# Patient Record
Sex: Male | Born: 1995 | Race: Black or African American | Hispanic: No | Marital: Single | Smoking: Never smoker
Health system: Southern US, Community
[De-identification: ages and names within clinical notes are randomized; demographics above are authoritative.]

## PROBLEM LIST (undated history)

## (undated) DIAGNOSIS — I2699 Other pulmonary embolism without acute cor pulmonale: Secondary | ICD-10-CM

## (undated) DIAGNOSIS — Z789 Other specified health status: Secondary | ICD-10-CM

---
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
## 2014-10-19 ENCOUNTER — Encounter: Payer: Self-pay | Admitting: Pediatrics

## 2015-04-19 ENCOUNTER — Observation Stay (HOSPITAL_BASED_OUTPATIENT_CLINIC_OR_DEPARTMENT_OTHER)
Admission: EM | Admit: 2015-04-19 | Discharge: 2015-04-21 | Disposition: A | Discharge status: Home / Self Care | Payer: BLUE CROSS/BLUE SHIELD | Attending: Internal Medicine | Admitting: Internal Medicine

## 2015-04-19 ENCOUNTER — Encounter (HOSPITAL_BASED_OUTPATIENT_CLINIC_OR_DEPARTMENT_OTHER): Payer: Self-pay

## 2015-04-19 DIAGNOSIS — R52 Pain, unspecified: Secondary | ICD-10-CM

## 2015-04-19 DIAGNOSIS — I2699 Other pulmonary embolism without acute cor pulmonale: Principal | ICD-10-CM | POA: Diagnosis present

## 2015-04-19 DIAGNOSIS — R079 Chest pain, unspecified: Secondary | ICD-10-CM

## 2015-04-19 HISTORY — DX: Other specified health status: Z78.9

## 2015-04-19 NOTE — ED Notes (Signed)
Pt c/o right upper back pain worse with movement, has not tried any medications at home, no recent cough, no strenuous movements.

## 2015-04-19 NOTE — ED Notes (Signed)
MD at bedside. 

## 2015-04-19 NOTE — ED Provider Notes (Signed)
CSN: [id number]     Arrival date & time 04/19/15  2112 History  By signing my name below, I, Gwenyth Ober, attest that this documentation has been prepared under the direction and in the presence of Tameria Patti, MD   Electronically Signed: Gwenyth Ober, ED Scribe. 04/19/2015. 11:59 PM.   Chief Complaint  Patient presents with  . Back Pain   Patient is a 19 y.o. male presenting with back pain. The history is provided by the patient. No language interpreter was used.  Back Pain Location:  Thoracic spine (right side mid scapular line) Quality:  Aching Radiates to:  Does not radiate Pain severity:  Moderate Pain is:  Same all the time Onset quality:  Sudden Timing:  Constant Progression:  Unchanged Chronicity:  New Context: not recent illness and not twisting   Relieved by:  None tried Worsened by:  Nothing tried Ineffective treatments:  None tried Associated symptoms: no chest pain and no leg pain   Risk factors: no hx of cancer and no recent surgery     HPI Comments: Equan Cogbill is a 19 y.o. male who presents to the Emergency Department complaining of sudden onset, moderate upper back pain. Pt has not tried any treatment for his symptoms PTA. His pain becomes worse with leaning forward. Pt states he is usually working from a sitting position for 5-8 hours at a time. His pain increased while working today. He denies recent long travel. Pt also denies SOB, cough, congestion, leg swelling and leg pain.  History reviewed. No pertinent past medical history. Past Surgical History  Procedure Laterality Date  . Orchiopexy  02/25/2012    Procedure: ORCHIOPEXY ADULT;  Surgeon: Marcine Matar, MD;  Location: [facility] OR;  Service: Urology;  Laterality: Bilateral;   No family history on file. Social History  Substance Use Topics  . Smoking status: Never Smoker   . Smokeless tobacco: None  . Alcohol Use: No    Review of Systems  HENT: Negative for congestion.   Respiratory:  Negative for cough and shortness of breath.   Cardiovascular: Negative for chest pain, palpitations and leg swelling.  Musculoskeletal: Positive for back pain. Negative for arthralgias.  All other systems reviewed and are negative.   Allergies  Review of patient's allergies indicates no known allergies.  Home Medications   Prior to Admission medications   Not on File   BP 116/66 mmHg  Pulse 71  Temp(Src) 97.7 F (36.5 C) (Oral)  Resp 18  Ht  (1.854 m)  Wt 135 lb (61.236 kg)  BMI 17.82 kg/m2  SpO2 100% Physical Exam  Constitutional: He is oriented to person, place, and time. He appears well-developed and well-nourished. No distress.  HENT:  Head: Normocephalic and atraumatic.  Mouth/Throat: Oropharynx is clear and moist. No oropharyngeal exudate.  Eyes: Conjunctivae and EOM are normal. Pupils are equal, round, and reactive to light.  Neck: Normal range of motion. Neck supple. No tracheal deviation present.  Cardiovascular: Normal rate, regular rhythm and normal heart sounds.   Pulmonary/Chest: Effort normal and breath sounds normal. No respiratory distress. He has no wheezes. He has no rales.  Abdominal: Soft. Bowel sounds are normal. There is no tenderness. There is no rebound and no guarding.  Musculoskeletal: Normal range of motion. He exhibits no edema or tenderness.  No step-offs, crepitus or point tenderness of C, T or L-spine  Neurological: He is alert and oriented to person, place, and time. He has normal reflexes.  Skin: Skin is  warm and dry.  Psychiatric: He has a normal mood and affect. His behavior is normal.  Nursing note and vitals reviewed.   ED Course  Procedures   DIAGNOSTIC STUDIES: Oxygen Saturation is 100% on RA, normal by my interpretation.    COORDINATION OF CARE: 11:57 PM Discussed treatment plan with pt which includes a chest x-ray and D-dimer. Pt agreed to plan.   Labs Review Labs Reviewed  D-DIMER, QUANTITATIVE (NOT AT [facility])     Imaging Review No results found. I have personally reviewed and evaluated these images and lab results as part of my medical decision-making.   EKG Interpretation None      MDM   Final diagnoses:  None    Results for orders placed or performed during the hospital encounter of 04/19/15  D-dimer, quantitative (not at [facility])  Result Value Ref Range   D-Dimer, Quant 0.62 (H) 0.00 - 0.48 ug/mL-FEU  CBC with Differential/Platelet  Result Value Ref Range   WBC 9.2 4.0 - 10.5 K/uL   RBC 4.96 4.22 - 5.81 MIL/uL   Hemoglobin 14.0 13.0 - 17.0 g/dL   HCT 16.1 09.6 - 04.5 %   MCV 84.7 78.0 - 100.0 fL   MCH 28.2 26.0 - 34.0 pg   MCHC 33.3 30.0 - 36.0 g/dL   RDW 40.9 81.1 - 91.4 %   Platelets 264 150 - 400 K/uL   Neutrophils Relative % 49 %   Neutro Abs 4.5 1.7 - 7.7 K/uL   Lymphocytes Relative 40 %   Lymphs Abs 3.7 0.7 - 4.0 K/uL   Monocytes Relative 10 %   Monocytes Absolute 0.9 0.1 - 1.0 K/uL   Eosinophils Relative 1 %   Eosinophils Absolute 0.1 0.0 - 0.7 K/uL   Basophils Relative 0 %   Basophils Absolute 0.0 0.0 - 0.1 K/uL  Basic metabolic panel  Result Value Ref Range   Sodium 142 135 - 145 mmol/L   Potassium 4.9 3.5 - 5.1 mmol/L   Chloride 108 101 - 111 mmol/L   CO2 28 22 - 32 mmol/L   Glucose, Bld 92 65 - 99 mg/dL   BUN 13 6 - 20 mg/dL   Creatinine, Ser 7.82 0.61 - 1.24 mg/dL   Calcium 9.1 8.9 - 95.6 mg/dL   GFR calc non Af Amer >60 >60 mL/min   GFR calc Af Amer >60 >60 mL/min   Anion gap 6 5 - 15   Dg Chest 2 View  04/20/2015   CLINICAL DATA:  RIGHT upper back pain, epigastric and mid chest discomfort.  EXAM: CHEST  2 VIEW  COMPARISON:  None.  FINDINGS: Cardiomediastinal silhouette is normal. The lungs are clear without pleural effusions or focal consolidations. Trachea projects midline and there is no pneumothorax. Soft tissue planes and included osseous structures are non-suspicious.  IMPRESSION: Normal chest.   Electronically Signed   By: Awilda Metro M.D.    On: 04/20/2015 01:43   Ct Angio Chest Pe W/cm &/or Wo Cm  04/20/2015   CLINICAL DATA:  Elevated D-dimer with left anterior lower chest pain for 4 weeks  EXAM: CT ANGIOGRAPHY CHEST WITH CONTRAST  TECHNIQUE: Multidetector CT imaging of the chest was performed using the standard protocol during bolus administration of intravenous contrast. Multiplanar CT image reconstructions and MIPs were obtained to evaluate the vascular anatomy.  CONTRAST:  OMNIPAQUE IOHEXOL 350 MG/ML SOLN  COMPARISON:  None.  FINDINGS: THORACIC INLET/BODY WALL:  No acute abnormality.  MEDIASTINUM:  There are bilateral subsegmental pulmonary  emboli seen in the right lower lobe on image 183 and likely on 207, and on the left in the lingula on image 169. No ischemic changes.  Normal heart size.  No pericardial effusion.  Negative aorta.  LUNG WINDOWS:  No consolidation.  No effusion.  UPPER ABDOMEN:  No acute findings.  OSSEOUS:  No acute fracture.  No suspicious lytic or blastic lesions.  Critical Value/emergent results were called by telephone at the time of interpretation on 04/20/2015 at 2:02 am to Dr. Cy Blamer , who verbally acknowledged these results.  Review of the MIP images confirms the above findings.  IMPRESSION: Bilateral subsegmental pulmonary embolism.   Electronically Signed   By: Marnee Spring M.D.   On: 04/20/2015 02:04    Medications  ketorolac (TORADOL) 30 MG/ML injection 30 mg (30 mg Intravenous Given 04/20/15 0025)  iohexol (OMNIPAQUE) 350 MG/ML injection 75 mL (100 mLs Intravenous Contrast Given 04/20/15 0142)  Rivaroxaban (XARELTO) tablet 15 mg (15 mg Oral Given 04/20/15 0236)   Will admit for B PEs  Per Dr. Mliss Sax and observation status not heparin   I personally performed the services described in this documentation, which was scribed in my presence. The recorded information has been reviewed and is accurate.     Cy Blamer, MD 04/20/15 [phone]

## 2015-04-20 ENCOUNTER — Emergency Department (HOSPITAL_BASED_OUTPATIENT_CLINIC_OR_DEPARTMENT_OTHER): Payer: BLUE CROSS/BLUE SHIELD

## 2015-04-20 ENCOUNTER — Encounter (HOSPITAL_BASED_OUTPATIENT_CLINIC_OR_DEPARTMENT_OTHER): Payer: Self-pay | Admitting: Emergency Medicine

## 2015-04-20 ENCOUNTER — Other Ambulatory Visit: Payer: Self-pay | Admitting: Hematology

## 2015-04-20 DIAGNOSIS — I2699 Other pulmonary embolism without acute cor pulmonale: Principal | ICD-10-CM

## 2015-04-20 HISTORY — DX: Other pulmonary embolism without acute cor pulmonale: I26.99

## 2015-04-20 LAB — CBC WITH DIFFERENTIAL/PLATELET
BASOS ABS: 0 10*3/uL (ref 0.0–0.1)
BASOS PCT: 0 %
Basophils Absolute: 0 10*3/uL (ref 0.0–0.1)
Basophils Relative: 0 %
EOS PCT: 1 %
Eosinophils Absolute: 0.1 10*3/uL (ref 0.0–0.7)
Eosinophils Absolute: 0.1 10*3/uL (ref 0.0–0.7)
Eosinophils Relative: 2 %
HCT: 42 % (ref 39.0–52.0)
HEMATOCRIT: 41 % (ref 39.0–52.0)
HEMOGLOBIN: 14 g/dL (ref 13.0–17.0)
Hemoglobin: 14 g/dL (ref 13.0–17.0)
LYMPHS ABS: 1.9 10*3/uL (ref 0.7–4.0)
Lymphocytes Relative: 28 %
Lymphocytes Relative: 40 %
Lymphs Abs: 3.7 10*3/uL (ref 0.7–4.0)
MCH: 28.2 pg (ref 26.0–34.0)
MCH: 29.1 pg (ref 26.0–34.0)
MCHC: 33.3 g/dL (ref 30.0–36.0)
MCHC: 34.1 g/dL (ref 30.0–36.0)
MCV: 84.7 fL (ref 78.0–100.0)
MCV: 85.2 fL (ref 78.0–100.0)
MONO ABS: 0.9 10*3/uL (ref 0.1–1.0)
MONOS PCT: 9 %
Monocytes Absolute: 0.6 10*3/uL (ref 0.1–1.0)
Monocytes Relative: 10 %
NEUTROS ABS: 4.1 10*3/uL (ref 1.7–7.7)
NEUTROS ABS: 4.5 10*3/uL (ref 1.7–7.7)
NEUTROS PCT: 61 %
Neutrophils Relative %: 49 %
PLATELETS: 264 10*3/uL (ref 150–400)
Platelets: 245 10*3/uL (ref 150–400)
RBC: 4.81 MIL/uL (ref 4.22–5.81)
RBC: 4.96 MIL/uL (ref 4.22–5.81)
RDW: 12.8 % (ref 11.5–15.5)
RDW: 12.9 % (ref 11.5–15.5)
WBC: 6.7 10*3/uL (ref 4.0–10.5)
WBC: 9.2 10*3/uL (ref 4.0–10.5)

## 2015-04-20 LAB — COMPREHENSIVE METABOLIC PANEL
ALT: 19 U/L (ref 17–63)
ANION GAP: 5 (ref 5–15)
AST: 25 U/L (ref 15–41)
Albumin: 3.8 g/dL (ref 3.5–5.0)
Alkaline Phosphatase: 63 U/L (ref 38–126)
BUN: 8 mg/dL (ref 6–20)
CHLORIDE: 108 mmol/L (ref 101–111)
CO2: 27 mmol/L (ref 22–32)
Calcium: 9.1 mg/dL (ref 8.9–10.3)
Creatinine, Ser: 0.66 mg/dL (ref 0.61–1.24)
GFR calc non Af Amer: >60 mL/min (ref >60)
Glucose, Bld: 107 mg/dL — ABNORMAL HIGH (ref 65–99)
POTASSIUM: 3.8 mmol/L (ref 3.5–5.1)
SODIUM: 140 mmol/L (ref 135–145)
Total Bilirubin: 0.8 mg/dL (ref 0.3–1.2)
Total Protein: 5.9 g/dL — ABNORMAL LOW (ref 6.5–8.1)

## 2015-04-20 LAB — BASIC METABOLIC PANEL
ANION GAP: 6 (ref 5–15)
BUN: 13 mg/dL (ref 6–20)
CALCIUM: 9.1 mg/dL (ref 8.9–10.3)
CO2: 28 mmol/L (ref 22–32)
Chloride: 108 mmol/L (ref 101–111)
Creatinine, Ser: 0.82 mg/dL (ref 0.61–1.24)
Glucose, Bld: 92 mg/dL (ref 65–99)
Potassium: 4.9 mmol/L (ref 3.5–5.1)
Sodium: 142 mmol/L (ref 135–145)

## 2015-04-20 LAB — D-DIMER, QUANTITATIVE (NOT AT ARMC): D DIMER QUANT: 0.62 ug{FEU}/mL — AB (ref 0.00–0.48)

## 2015-04-20 LAB — TROPONIN I

## 2015-04-20 LAB — BRAIN NATRIURETIC PEPTIDE: B NATRIURETIC PEPTIDE 5: 28.8 pg/mL (ref 0.0–100.0)

## 2015-04-20 MED ORDER — HEPARIN BOLUS VIA INFUSION: 4000.0000 [IU] | Freq: Once | INTRAVENOUS | Status: DC

## 2015-04-20 MED ORDER — ONDANSETRON HCL 4 MG PO TABS: 4.0000 mg | ORAL_TABLET | Freq: Four times a day (QID) | ORAL | Status: DC | PRN

## 2015-04-20 MED ORDER — HEPARIN (PORCINE) IN NACL 100-0.45 UNIT/ML-% IJ SOLN: 1100.0000 [IU]/h | INTRAMUSCULAR | Status: DC

## 2015-04-20 MED ORDER — SODIUM CHLORIDE 0.9 % IV SOLN
INTRAVENOUS | Status: DC
  Administered 2015-04-20: 06:00:00 via INTRAVENOUS

## 2015-04-20 MED ORDER — ACETAMINOPHEN 325 MG PO TABS: 650.0000 mg | ORAL_TABLET | Freq: Four times a day (QID) | ORAL | Status: DC | PRN

## 2015-04-20 MED ORDER — RIVAROXABAN 15 MG PO TABS
15.0000 mg | ORAL_TABLET | Freq: Two times a day (BID) | ORAL | Status: DC
  Administered 2015-04-20 – 2015-04-21 (×2): 15 mg via ORAL
  Filled 2015-04-20 (×2): qty 1

## 2015-04-20 MED ORDER — IOHEXOL 350 MG/ML SOLN
75.0000 mL | Freq: Once | INTRAVENOUS | Status: AC | PRN
  Administered 2015-04-20: 100 mL via INTRAVENOUS

## 2015-04-20 MED ORDER — RIVAROXABAN (XARELTO) EDUCATION KIT FOR DVT/PE PATIENTS
PACK | Freq: Once | Status: AC
  Administered 2015-04-20: 12:00:00
  Filled 2015-04-20 (×2): qty 1

## 2015-04-20 MED ORDER — ACETAMINOPHEN 650 MG RE SUPP: 650.0000 mg | Freq: Four times a day (QID) | RECTAL | Status: DC | PRN

## 2015-04-20 MED ORDER — ONDANSETRON HCL 4 MG/2ML IJ SOLN: 4.0000 mg | Freq: Four times a day (QID) | INTRAMUSCULAR | Status: DC | PRN

## 2015-04-20 MED ORDER — KETOROLAC TROMETHAMINE 30 MG/ML IJ SOLN
30.0000 mg | Freq: Once | INTRAMUSCULAR | Status: AC
  Administered 2015-04-20: 30 mg via INTRAVENOUS
  Filled 2015-04-20: qty 1

## 2015-04-20 MED ORDER — RIVAROXABAN 15 MG PO TABS
15.0000 mg | ORAL_TABLET | Freq: Once | ORAL | Status: AC
  Administered 2015-04-20: 15 mg via ORAL
  Filled 2015-04-20: qty 1

## 2015-04-20 NOTE — ED Notes (Signed)
Pt transferred to [facility] via [facility] 

## 2015-04-20 NOTE — Progress Notes (Addendum)
Pharmacist Provided - Patient Medication Education    Frank Frederick is an 19 y.o. male who presented to [facility] on 04/19/2015 with a chief complaint of  Chief Complaint  Patient presents with  . Back Pain     The following medications were discussed with the patient: Xarelto  Pain Control medications:  Yes     No  Diabetes Medications:  Yes     No  Heart Failure Medications:  Yes     No  Anticoagulation Medications:   Yes     No  Antibiotics at discharge:  Yes     No  Allergy Assessment Completed and Updated:  Yes     No Identified Patient Allergies: No Known Allergies   Medication Adherence Assessment: No PTA medications Barriers to Obtaining Medications:  Yes  No   Assessment: Educated patient on dosing regimen changes that will occur during his treatment with Xarelto (Xarelto 15 mg BID x 21 days, then 20 mg daily). Patient voiced understanding. Reviewed s/sx of bleeding.  Time spent preparing for counseling: 5 Time spent counseling patient: 10  Remi Haggard, PharmD Clinical Pharmacist- Resident Pager: [phone]  Remi Haggard, PharmD 04/20/2015, 3:08 PM

## 2015-04-20 NOTE — Care Management Note (Signed)
Case Management Note  Patient Details  Name: Frank Frederick MRN: [id number] Date of Birth: Dec 02, 1995  Subjective/Objective:      Date: 04/20/15 Spoke with patient at the bedside. Introduced self as Sports coach and explained role in discharge planning and how to be reached. Verified patient lives in town, with parents,  . Expressed no potential need for no other DME. Verified patient anticipates to go home with family,  at time of discharge and will have full-time  supervision by family  at this time to best of their knowledge. Patient  denied needing help with their medication. Patient is driven by  Mother or father to MD appointments. Verified patient has PCP [place]. Patient would like for NCM to call his mother and inform her about the xarelto medication, her name is Justina [phone].  NCM called Justina and informed her about the savings cards.   Plan: CM will continue to follow for discharge planning and [facility] resources.               Action/Plan:   Expected Discharge Date:                  Expected Discharge Plan:  Home/Self Care  In-House Referral:     Discharge planning Services  CM Consult  Post Acute Care Choice:    Choice offered to:     DME Arranged:    DME Agency:     HH Arranged:    HH Agency:     Status of Service:  Completed, signed off  Medicare Important Message Given:    Date Medicare IM Given:    Medicare IM give by:    Date Additional Medicare IM Given:    Additional Medicare Important Message give by:     If discussed at Long Length of Stay Meetings, dates discussed:    Additional Comments:  Leone Haven, RN 04/20/2015, 1:40 PM

## 2015-04-20 NOTE — Progress Notes (Signed)
Patient Demographics  Frank Frederick, is a 19 y.o. male, DOB - September 06, 1995, ZOX:[id number]  Admit date - 04/19/2015   Admitting Physician Rolly Salter, MD  Outpatient Primary MD for the patient is Georgiann Hahn, MD  LOS -    Chief Complaint  Patient presents with  . Back Pain         Subjective:   [name] today has, No headache, No chest pain, No abdominal pain - No Nausea, No new weakness tingling or numbness, No Cough - SOB.   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 - Hemodynamically stable, continue to monitor on cardiac telemetry. - Started on Xarelto. - Genetic part of hypertrophic hypercoagulable workup was ordered. - Patient to follow with Dr. Candise Che  as an outpatient. - Observation for 24 hours.  Code Status: Full  Family Communication: Father and mother at bedside  Disposition Plan: Home in 24 hours.   Procedures None   Consults   Discussed with hematology over the phone   Medications  Scheduled Meds: . Rivaroxaban  15 mg Oral BID WC   Continuous Infusions: . sodium chloride 10 mL/hr at 04/20/15 0605   PRN Meds:.acetaminophen **OR** acetaminophen, ondansetron **OR** ondansetron (ZOFRAN) IV  DVT Prophylaxis  Xarelto  Lab Results  Component Value Date   PLT 245 04/20/2015    Antibiotics    Anti-infectives    None          Objective:   Filed Vitals:   04/20/15 0207 04/20/15 0230 04/20/15 0300 04/20/15 0355  BP:  122/67 116/86 115/68  Pulse:  60 61 63  Temp:   97.5 F (36.4 C) 97.7 F (36.5 C)  TempSrc:    Oral  Resp:  Height:      Weight: 59.875 kg (132 lb)     SpO2:  99% 100% 100%    Wt  Readings from Last 3 Encounters:  04/20/15 59.875 kg (132 lb)  02/17/13 55.43 kg (122 lb 3.2 oz) (7 %*, Z = -1.44)  02/25/12 55.792 kg (123 lb) (13 %*, Z = -1.13)   * Growth percentiles are based on CDC 2-20 Years data.    No intake or output data in the 24 hours ending 04/20/15 1150   Physical Exam  Awake Alert, Oriented X 3, No new F.N deficits, Normal affect [place].AT,PERRAL Supple Neck,No JVD, No cervical lymphadenopathy appriciated.  Symmetrical Chest wall movement, Good air movement bilaterally, CTAB RRR,No Gallops,Rubs or new Murmurs, No Parasternal Heave +ve B.Sounds, Abd Soft, No tenderness, No organomegaly appriciated, No rebound - guarding or rigidity. No Cyanosis, Clubbing or edema, No new Rash or bruise     Data Review   Micro Results No results found for this or any previous visit (from the past 240 hour(s)).  Radiology Reports Dg Chest 2 View  04/20/2015   CLINICAL DATA:  RIGHT upper back pain, epigastric and mid chest discomfort.  EXAM: CHEST  2 VIEW  COMPARISON:  None.  FINDINGS: Cardiomediastinal silhouette is  normal. The lungs are clear without pleural effusions or focal consolidations. Trachea projects midline and there is no pneumothorax. Soft tissue planes and included osseous structures are non-suspicious.  IMPRESSION: Normal chest.   Electronically Signed   By: Awilda Metro M.D.   On: 04/20/2015 01:43   Ct Angio Chest Pe W/cm &/or Wo Cm  04/20/2015   CLINICAL DATA:  Elevated D-dimer with left anterior lower chest pain for 4 weeks  EXAM: CT ANGIOGRAPHY CHEST WITH CONTRAST  TECHNIQUE: Multidetector CT imaging of the chest was performed using the standard protocol during bolus administration of intravenous contrast. Multiplanar CT image reconstructions and MIPs were obtained to evaluate the vascular anatomy.  CONTRAST:  OMNIPAQUE IOHEXOL 350 MG/ML SOLN  COMPARISON:  None.  FINDINGS: THORACIC INLET/BODY WALL:  No acute abnormality.  MEDIASTINUM:  There are  bilateral subsegmental pulmonary emboli seen in the right lower lobe on image 183 and likely on 207, and on the left in the lingula on image 169. No ischemic changes.  Normal heart size.  No pericardial effusion.  Negative aorta.  LUNG WINDOWS:  No consolidation.  No effusion.  UPPER ABDOMEN:  No acute findings.  OSSEOUS:  No acute fracture.  No suspicious lytic or blastic lesions.  Critical Value/emergent results were called by telephone at the time of interpretation on 04/20/2015 at 2:02 am to Dr. Cy Blamer , who verbally acknowledged these results.  Review of the MIP images confirms the above findings.  IMPRESSION: Bilateral subsegmental pulmonary embolism.   Electronically Signed   By: Marnee Spring M.D.   On: 04/20/2015 02:04     CBC  Recent Labs Lab 04/20/15 0015 04/20/15 1101  WBC 9.2 6.7  HGB 14.0 14.0  HCT 42.0 41.0  PLT 264 245  MCV 84.7 85.2  MCH 28.2 29.1  MCHC 33.3 34.1  RDW 12.8 12.9  LYMPHSABS 3.7 1.9  MONOABS 0.9 0.6  EOSABS 0.1 0.1  BASOSABS 0.0 0.0    Chemistries   Recent Labs Lab 04/20/15 0015  NA 142  K 4.9  CL 108  CO2 28  GLUCOSE 92  BUN 13  CREATININE 0.82  CALCIUM 9.1   ------------------------------------------------------------------------------------------------------------------ estimated creatinine clearance is 121.7 mL/min (by C-G formula based on Cr of 0.82). ------------------------------------------------------------------------------------------------------------------ No results for input(s): HGBA1C in the last 72 hours. ------------------------------------------------------------------------------------------------------------------ No results for input(s): CHOL, HDL, LDLCALC, TRIG, CHOLHDL, LDLDIRECT in the last 72 hours. ------------------------------------------------------------------------------------------------------------------ No results for input(s): TSH, T4TOTAL, T3FREE, THYROIDAB in the last 72 hours.  Invalid  input(s): FREET3 ------------------------------------------------------------------------------------------------------------------ No results for input(s): VITAMINB12, FOLATE, FERRITIN, TIBC, IRON, RETICCTPCT in the last 72 hours.  Coagulation profile No results for input(s): INR, PROTIME in the last 168 hours.   Recent Labs  04/19/15 2355  DDIMER 0.62*    Cardiac Enzymes No results for input(s): CKMB, TROPONINI, MYOGLOBIN in the last 168 hours.  Invalid input(s): CK ------------------------------------------------------------------------------------------------------------------ Invalid input(s): POCBNP     Time Spent in minutes   30 minutes   ELGERGAWY, DAWOOD M.D on 04/20/2015 at 11:50 AM  Between 7am to 7pm - Pager - [phone]  After 7pm go to [email or web address] - password [email or web address]  [facility]   Office  [phone]

## 2015-04-20 NOTE — Progress Notes (Addendum)
Per rep:   Xarelto: Frank Frederick is required [phone]   Patient has deductible of $6393.00- $0 has been met- patient will have to satisfy deductible before a co-pay can be given.   Currently the cost of the medication will be approx $109.65 for 30 day supply at retail    [facility] will give patient free 30 day supply and also the $0 co pay savings cards.     88m bid for 21 days pre auth case number is [id number] auth case number is [id number] auJosem Kaufmanns good til sept 30 2017

## 2015-04-20 NOTE — Discharge Instructions (Signed)
Information on my medicine - XARELTO® (rivaroxaban) ° °This medication education was reviewed with me or my healthcare representative as part of my discharge preparation.  The pharmacist that spoke with me during my hospital stay was:  Endre Coutts Kay, RPH ° °WHY WAS XARELTO® PRESCRIBED FOR YOU? °Xarelto® was prescribed to treat blood clots that may have been found in the veins of your legs (deep vein thrombosis) or in your lungs (pulmonary embolism) and to reduce the risk of them occurring again. ° °What do you need to know about Xarelto®? °The starting dose is one 15 mg tablet taken TWICE daily with food for the FIRST 21 DAYS then  the dose is changed to one 20 mg tablet taken ONCE A DAY with your evening meal. ° °DO NOT stop taking Xarelto® without talking to the health care provider who prescribed the medication.  Refill your prescription for 20 mg tablets before you run out. ° °After discharge, you should have regular check-up appointments with your healthcare provider that is prescribing your Xarelto®.  In the future your dose may need to be changed if your kidney function changes by a significant amount. ° °What do you do if you miss a dose? °If you are taking Xarelto® TWICE DAILY and you miss a dose, take it as soon as you remember. You may take two 15 mg tablets (total 30 mg) at the same time then resume your regularly scheduled 15 mg twice daily the next day. ° °If you are taking Xarelto® ONCE DAILY and you miss a dose, take it as soon as you remember on the same day then continue your regularly scheduled once daily regimen the next day. Do not take two doses of Xarelto® at the same time.  ° °Important Safety Information °Xarelto® is a blood thinner medicine that can cause bleeding. You should call your healthcare provider right away if you experience any of the following: °? Bleeding from an injury or your nose that does not stop. °? Unusual colored urine (red or dark brown) or unusual colored stools  (red or black). °? Unusual bruising for unknown reasons. °? A serious fall or if you hit your head (even if there is no bleeding). ° °Some medicines may interact with Xarelto® and might increase your risk of bleeding while on Xarelto®. To help avoid this, consult your healthcare provider or pharmacist prior to using any new prescription or non-prescription medications, including herbals, vitamins, non-steroidal anti-inflammatory drugs (NSAIDs) and supplements. ° °This website has more information on Xarelto®: www.xarelto.com. °

## 2015-04-20 NOTE — Progress Notes (Signed)
ANTICOAGULATION CONSULT NOTE - Initial Consult  Pharmacy Consult for Xarelto Indication: pulmonary embolus  No Known Allergies  Patient Measurements: Height:  (185.4 cm) Weight: 132 lb (59.875 kg) IBW/kg (Calculated) : 79.9  Vital Signs: Temp: 97.7 F (36.5 C) (09/30 0355) Temp Source: Oral (09/30 0355) BP: 115/68 mmHg (09/30 0355) Pulse Rate: 63 (09/30 0355)  Labs:  Recent Labs  04/20/15 0015  HGB 14.0  HCT 42.0  PLT 264  CREATININE 0.82    Estimated Creatinine Clearance: 121.7 mL/min (by C-G formula based on Cr of 0.82).   Medical History: Past Medical History  Diagnosis Date  . Medical history non-contributory     Medications:  No prescriptions prior to admission    Assessment: 19 y.o. male with new PE for Xarelto.     Plan:  Xarelto 15 mg BID x 21 days, then 20 mg daily  Abbott, Gary Fleet 04/20/2015,5:34 AM

## 2015-04-20 NOTE — Progress Notes (Signed)
NCM awaiting benefits check for xarelto.

## 2015-04-20 NOTE — H&P (Signed)
[facility] History and Physical  Antionne Enrique ZOX:[id number] DOB: 11-20-1995 DOA: 04/19/2015  Referring physician: Patient was transferred from [facility] [place]. PCP: Georgiann Hahn, MD  Specialists: None.  Chief Complaint: Right-sided chest pain.  HPI: Frank Frederick is a 19 y.o. male with no significant past medical history presented to the ER because of ongoing right-sided chest pain. Pain is mostly on deep breathing. Denies any shortness of breath. Patient has been having these symptoms for last 2 weeks. Denies any recent travel or any recent surgery. CT angiogram of the chest shows bilateral subsegmental pulmonary embolism. Patient was hemodynamically stable and started on Zaroxolyn admitted for further observation. On my exam patient is presently not in distress. Patient has not had any previous history of DVT or pulmonary embolism. No family history of DVT or pulmonary embolism. Patient states he sits a lot on computer.   Review of Systems: As presented in the history of presenting illness, rest negative.  Past Medical History  Diagnosis Date  . Medical history non-contributory    Past Surgical History  Procedure Laterality Date  . Orchiopexy  02/25/2012    Procedure: ORCHIOPEXY ADULT;  Surgeon: Marcine Matar, MD;  Location[facility] OR;  Service: Urology;  Laterality: Bilateral;   Social History:  reports that he has never smoked. He does not have any smokeless tobacco history on file. He reports that he does not drink alcohol or use illicit drugs. Where does patient live home. Can patient participate in ADLs? Yes.  No Known Allergies  Family History:  Family History  Problem Relation Age of Onset  . Hypertension Mother       Prior to Admission medications   Not on File    Physical Exam: Filed Vitals:   04/20/15 0207 04/20/15 0230 04/20/15 0300 04/20/15 0355  BP:  122/67 116/86 115/68  Pulse:  60 61 63  Temp:   97.5 F (36.4 C) 97.7 F (36.5 C)   TempSrc:    Oral  Resp:  Height:      Weight: 59.875 kg (132 lb)     SpO2:  99% 100% 100%     General:  Moderately built and nourished.  Eyes: Anicteric no pallor.  ENT: No discharge from the ears eyes nose and mouth.  Neck: No mass felt.  Cardiovascular: S1-S2 heard.  Respiratory: No rhonchi or crepitations.  Abdomen: Soft nontender bowel sounds present.  Skin: No rash.  Musculoskeletal: No edema.  Psychiatric: Appears normal.  Neurologic: Alert awake oriented to time place and person. Moves all extremities.  Labs on Admission:  Basic Metabolic Panel:  Recent Labs Lab 04/20/15 0015  NA 142  K 4.9  CL 108  CO2 28  GLUCOSE 92  BUN 13  CREATININE 0.82  CALCIUM 9.1   Liver Function Tests: No results for input(s): AST, ALT, ALKPHOS, BILITOT, PROT, ALBUMIN in the last 168 hours. No results for input(s): LIPASE, AMYLASE in the last 168 hours. No results for input(s): AMMONIA in the last 168 hours. CBC:  Recent Labs Lab 04/20/15 0015  WBC 9.2  NEUTROABS 4.5  HGB 14.0  HCT 42.0  MCV 84.7  PLT 264   Cardiac Enzymes: No results for input(s): CKTOTAL, CKMB, CKMBINDEX, TROPONINI in the last 168 hours.  BNP (last 3 results) No results for input(s): BNP in the last 8760 hours.  ProBNP (last 3 results) No results for input(s): PROBNP in the last 8760 hours.  CBG: No results for input(s): GLUCAP  in the last 168 hours.  Radiological Exams on Admission: Dg Chest 2 View  04/20/2015   CLINICAL DATA:  RIGHT upper back pain, epigastric and mid chest discomfort.  EXAM: CHEST  2 VIEW  COMPARISON:  None.  FINDINGS: Cardiomediastinal silhouette is normal. The lungs are clear without pleural effusions or focal consolidations. Trachea projects midline and there is no pneumothorax. Soft tissue planes and included osseous structures are non-suspicious.  IMPRESSION: Normal chest.   Electronically Signed   By: Awilda Metro M.D.   On: 04/20/2015 01:43   Ct  Angio Chest Pe W/cm &/or Wo Cm  04/20/2015   CLINICAL DATA:  Elevated D-dimer with left anterior lower chest pain for 4 weeks  EXAM: CT ANGIOGRAPHY CHEST WITH CONTRAST  TECHNIQUE: Multidetector CT imaging of the chest was performed using the standard protocol during bolus administration of intravenous contrast. Multiplanar CT image reconstructions and MIPs were obtained to evaluate the vascular anatomy.  CONTRAST:  OMNIPAQUE IOHEXOL 350 MG/ML SOLN  COMPARISON:  None.  FINDINGS: THORACIC INLET/BODY WALL:  No acute abnormality.  MEDIASTINUM:  There are bilateral subsegmental pulmonary emboli seen in the right lower lobe on image 183 and likely on 207, and on the left in the lingula on image 169. No ischemic changes.  Normal heart size.  No pericardial effusion.  Negative aorta.  LUNG WINDOWS:  No consolidation.  No effusion.  UPPER ABDOMEN:  No acute findings.  OSSEOUS:  No acute fracture.  No suspicious lytic or blastic lesions.  Critical Value/emergent results were called by telephone at the time of interpretation on 04/20/2015 at 2:02 am to Dr. Cy Blamer , who verbally acknowledged these results.  Review of the MIP images confirms the above findings.  IMPRESSION: Bilateral subsegmental pulmonary embolism.   Electronically Signed   By: Marnee Spring M.D.   On: 04/20/2015 02:04    EKG: Independently reviewed. Sinus bradycardia with RSR pattern.  Assessment/Plan Principal Problem:   Pulmonary emboli Active Problems:   Pulmonary embolism   1. Bilateral subsegmental pulmonary embolism unprovoked hemodynamically stable - patient has been placed on xarelto. Which will be continued. Patient is presently hemodynamically stable. Case manager consult for home medication. I have advised patient and patient's parents that patient will need to follow with his PCP and hematologist to further workup and hypercoagulable workup and duration of his anticoagulation which could be at least 6 months from now and  further doses will be based on his hypercoagulable workup. Check Dopplers of the lower extremity. Check troponin and BNP.   DVT Prophylaxis xarelto.  Code Status: Full code.  Family Communication: Patient's parents.  Disposition Plan: Admit for observation.    KAKRAKANDY,ARSHAD N. [facility] Pager [phone].  If 7PM-7AM, please contact night-coverage [email or web address] Password [email or web address] 04/20/2015, 5:28 AM

## 2015-04-21 ENCOUNTER — Observation Stay (HOSPITAL_BASED_OUTPATIENT_CLINIC_OR_DEPARTMENT_OTHER): Payer: BLUE CROSS/BLUE SHIELD

## 2015-04-21 DIAGNOSIS — I2699 Other pulmonary embolism without acute cor pulmonale: Secondary | ICD-10-CM | POA: Diagnosis not present

## 2015-04-21 MED ORDER — RIVAROXABAN (XARELTO) VTE STARTER PACK (15 & 20 MG)
ORAL_TABLET | ORAL | Status: DC
Start: 2015-04-21 — End: 2015-06-08

## 2015-04-21 NOTE — Progress Notes (Signed)
VASCULAR LAB PRELIMINARY  PRELIMINARY  PRELIMINARY  PRELIMINARY  Bilateral lower extremity venous duplex completed.    Preliminary report:  Bilateral:  No evidence of DVT, superficial thrombosis, or Baker's Cyst.   Frank Frederick, RVS 04/21/2015, 9:19 AM

## 2015-04-21 NOTE — Discharge Summary (Signed)
Frank Frederick, is a 19 y.o. male  DOB 1995/11/01  MRN [id number].  Admission date:  04/19/2015  Admitting Physician  Rolly Salter, MD  Discharge Date:  04/21/2015   Primary MD  Georgiann Hahn, MD  Recommendations for primary care physician for things to follow:  - Check Labs including CBC, BMP during next visit. - She told me to follow with hematology as an outpatient, to follow on hypercoagulable lab workup further recommendation for anticoagulation, patient will be seen by Zonia Kief   Admission Diagnosis  Pain [R52] Pulmonary emboli (HCC) [I26.99]   Discharge Diagnosis  Pain [R52] Pulmonary emboli (HCC) [I26.99]    Principal Problem:   Pulmonary emboli Active Problems:   Pulmonary embolism      Past Medical History  Diagnosis Date  . Medical history non-contributory     Past Surgical History  Procedure Laterality Date  . Orchiopexy  02/25/2012    Procedure: ORCHIOPEXY ADULT;  Surgeon: Marcine Matar, MD;  Location: [facility] OR;  Service: Urology;  Laterality: Bilateral;       History of present illness and  Hospital Course:     Kindly see H&P for history of present illness and admission details, please review complete Labs, Consult reports and Test reports for all details in brief  HPI  from the history and physical done on the day of admission  Frank Frederick is a 19 y.o. male with no significant past medical history presented to the ER because of ongoing right-sided chest pain. Pain is mostly on deep breathing. Denies any shortness of breath. Patient has been having these symptoms for last 2 weeks. Denies any recent travel or any recent surgery. CT angiogram of the chest shows bilateral subsegmental pulmonary embolism. Patient was hemodynamically stable and started on Zaroxolyn admitted for further observation. On my exam patient is presently not in distress. Patient has  not had any previous history of DVT or pulmonary embolism. No family history of DVT or pulmonary embolism. Patient states he sits a lot on computer.   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 Tresa Endo from hematology as an outpatient. - Venous Doppler with no evidence of DVT  Discharge Condition:  Stable   Follow UP  Follow-up Information    Follow up with Georgiann Hahn, MD. Call in 1 week.   Specialty:  Pediatrics   Why:  Posthospitalization follow-up   Contact information:   [address] [place] [address] [phone]       Follow up with Wyvonnia Lora, MD.   Specialties:  Hematology, Oncology   Why:  Will be called by his office for follow-up appointment   Contact information:  [address] [place] [address] [phone]         Discharge Instructions  and  Discharge Medications     Discharge Instructions    Discharge instructions    Complete by:  As directed   Continue with regular diet     Increase activity slowly    Complete by:  As directed             Medication List    STOP taking these medications        ALIVE MENS ENERGY PO      TAKE these medications        Rivaroxaban 15 & 20 MG Tbpk  Commonly known as:  XARELTO STARTER PACK  Take as directed on package: Start with one  tablet by mouth twice a day with food. On Day 22, switch to one  tablet once a day with food.          Diet and Activity recommendation: See Discharge Instructions above   Consults obtained -  Discussed with hematology over the phone   Major procedures and Radiology Reports -  PLEASE review detailed and final reports for all details, in brief -      Dg Chest 2 View  04/20/2015   CLINICAL DATA:  RIGHT upper back pain, epigastric and mid chest discomfort.  EXAM: CHEST  2 VIEW  COMPARISON:  None.  FINDINGS: Cardiomediastinal silhouette is normal. The lungs are clear without pleural effusions or focal consolidations. Trachea projects midline and there is no pneumothorax. Soft tissue planes and included osseous structures are non-suspicious.  IMPRESSION: Normal chest.   Electronically Signed   By: Awilda Metro M.D.   On: 04/20/2015 01:43   Ct Angio Chest Pe W/cm &/or Wo Cm  04/20/2015   CLINICAL DATA:  Elevated D-dimer with left anterior lower chest pain for 4 weeks  EXAM: CT ANGIOGRAPHY CHEST WITH CONTRAST  TECHNIQUE: Multidetector CT imaging of the chest was performed using the standard protocol during bolus administration of intravenous contrast. Multiplanar CT image reconstructions and MIPs were obtained to evaluate the vascular anatomy.  CONTRAST:  OMNIPAQUE IOHEXOL 350 MG/ML SOLN  COMPARISON:  None.  FINDINGS: THORACIC INLET/BODY WALL:  No acute abnormality.  MEDIASTINUM:  There are bilateral subsegmental pulmonary emboli seen in the right lower lobe on image 183 and likely on 207, and on the left in the lingula on image 169. No ischemic changes.  Normal heart size.  No pericardial effusion.  Negative aorta.  LUNG WINDOWS:  No consolidation.  No effusion.  UPPER ABDOMEN:  No acute findings.  OSSEOUS:  No acute fracture.  No suspicious lytic or blastic lesions.  Critical Value/emergent results were called by telephone at the time of interpretation on 04/20/2015 at 2:02 am to Dr. Cy Blamer , who verbally acknowledged these results.  Review of the MIP images confirms the above findings.  IMPRESSION: Bilateral subsegmental pulmonary embolism.   Electronically Signed   By: Marnee Spring M.D.   On: 04/20/2015 02:04    Micro Results     No results found for this  or any previous visit (from the past 240 hour(s)).     Today   Subjective:   Frank Frederick today has no headache,no abdominal pain,no new weakness tingling or numbness, feels much better wants to go home today. As any chest pain or dyspnea.  Objective:   Blood pressure 108/64, pulse 58, temperature 97.6 F (36.4 C), temperature source Oral, resp. rate 18, height   (1.854 m), weight 59.875 kg (132 lb), SpO2 100 %.   Intake/Output Summary (Last 24 hours) at 04/21/15 0936 Last data filed at 04/20/15 1800  Gross per 24 hour  Intake 239.17 ml  Output      0 ml  Net 239.17 ml    Exam Awake Alert, Oriented x 3, No new F.N deficits, Normal affect [place].AT,PERRAL Supple Neck,No JVD, No cervical lymphadenopathy appriciated.  Symmetrical Chest wall movement, Good air movement bilaterally, CTAB RRR,No Gallops,Rubs or new Murmurs, No Parasternal Heave +ve B.Sounds, Abd Soft, Non tender, No organomegaly appriciated, No rebound -guarding or rigidity. No Cyanosis, Clubbing or edema, No new Rash or bruise  Data Review   CBC w Diff: Lab Results  Component Value Date   WBC 6.7 04/20/2015   HGB 14.0 04/20/2015   HCT 41.0 04/20/2015   PLT 245 04/20/2015   LYMPHOPCT 28 04/20/2015   MONOPCT 9 04/20/2015   EOSPCT 2 04/20/2015   BASOPCT 0 04/20/2015    CMP: Lab Results  Component Value Date   NA 140 04/20/2015   K 3.8 04/20/2015   CL 108 04/20/2015   CO2 27 04/20/2015   BUN 8 04/20/2015   CREATININE 0.66 04/20/2015   PROT 5.9* 04/20/2015   ALBUMIN 3.8 04/20/2015   BILITOT 0.8 04/20/2015   ALKPHOS 63 04/20/2015   AST 25 04/20/2015   ALT 19 04/20/2015  .   Total Time in preparing paper work, data evaluation and todays exam - 35 minutes  Frank Frederick M.D on 04/21/2015 at 9:36 AM  [facility]   Office  [phone]

## 2015-04-21 NOTE — Progress Notes (Signed)
Patient discharge teaching given, including activity, diet, follow up appointment, and mediations. Patient verbalized understanding of all discharge instructions. IV access was discontinued. Vitals are stable, Skin intact except as charted in most recent assessments. Patient to be escorted out by NT, to be driven home by family. 

## 2015-04-22 LAB — BETA-2-GLYCOPROTEIN I ABS, IGG/M/A

## 2015-04-22 LAB — HOMOCYSTEINE: HOMOCYSTEINE-NORM: 5.8 umol/L (ref 0.0–15.0)

## 2015-04-23 ENCOUNTER — Telehealth: Payer: Self-pay | Admitting: Hematology

## 2015-04-23 LAB — LUPUS ANTICOAGULANT PANEL
DRVVT: 58.2 s — AB (ref 0.0–55.1)
PTT Lupus Anticoagulant: 52.2 s — ABNORMAL HIGH (ref 0.0–50.0)

## 2015-04-23 LAB — CARDIOLIPIN ANTIBODIES, IGG, IGM, IGA
Anticardiolipin IgG: <9 GPL U/mL (ref 0–14)
Anticardiolipin IgM: <9 MPL U/mL (ref 0–12)

## 2015-04-23 LAB — DRVVT MIX: DRVVT MIX: 49.3 s — AB (ref 0.0–45.4)

## 2015-04-23 LAB — PTT-LA INCUB MIX: PTT-LA Incub Mix: 46.5 s (ref 0.0–50.0)

## 2015-04-23 LAB — PTT-LA MIX: PTT-LA MIX: 43.3 s (ref 0.0–50.0)

## 2015-04-23 LAB — DRVVT CONFIRM: DRVVT CONFIRM: 1.1 ratio (ref 0.0–1.4)

## 2015-04-23 NOTE — Telephone Encounter (Signed)
Message to Tiffany in HIM re hosp fu appointment. Pof sent by GK 04/20/15 - removed from scheduling inbox.

## 2015-04-24 LAB — FACTOR 5 LEIDEN

## 2015-04-25 LAB — PROTHROMBIN GENE MUTATION

## 2015-05-14 ENCOUNTER — Telehealth: Payer: Self-pay | Admitting: Hematology

## 2015-05-14 NOTE — Telephone Encounter (Signed)
hosp f/u appt-s/w patient mother and gave appt for 11/18 @ 1015 w/Dr. Candise CheKale

## 2015-06-08 ENCOUNTER — Ambulatory Visit (HOSPITAL_BASED_OUTPATIENT_CLINIC_OR_DEPARTMENT_OTHER): Payer: BLUE CROSS/BLUE SHIELD

## 2015-06-08 ENCOUNTER — Encounter: Payer: Self-pay | Admitting: Hematology

## 2015-06-08 ENCOUNTER — Ambulatory Visit (HOSPITAL_BASED_OUTPATIENT_CLINIC_OR_DEPARTMENT_OTHER): Payer: BLUE CROSS/BLUE SHIELD | Admitting: Hematology

## 2015-06-08 ENCOUNTER — Telehealth: Payer: Self-pay | Admitting: Hematology

## 2015-06-08 VITALS — BP 109/71 | HR 65 | Resp 18 | Ht 73.0 in | Wt 130.7 lb

## 2015-06-08 DIAGNOSIS — I2699 Other pulmonary embolism without acute cor pulmonale: Secondary | ICD-10-CM

## 2015-06-08 LAB — CBC & DIFF AND RETIC
BASO%: 0.4 % (ref 0.0–2.0)
BASOS ABS: 0 10*3/uL (ref 0.0–0.1)
EOS%: 2.1 % (ref 0.0–7.0)
Eosinophils Absolute: 0.1 10*3/uL (ref 0.0–0.5)
HEMATOCRIT: 44.6 % (ref 38.4–49.9)
HGB: 15 g/dL (ref 13.0–17.1)
Immature Retic Fract: 4.1 % (ref 3.00–10.60)
LYMPH#: 1.8 10*3/uL (ref 0.9–3.3)
LYMPH%: 38.1 % (ref 14.0–49.0)
MCH: 28.4 pg (ref 27.2–33.4)
MCHC: 33.6 g/dL (ref 32.0–36.0)
MCV: 84.3 fL (ref 79.3–98.0)
MONO#: 0.5 10*3/uL (ref 0.1–0.9)
MONO%: 10.3 % (ref 0.0–14.0)
NEUT#: 2.4 10*3/uL (ref 1.5–6.5)
NEUT%: 49.1 % (ref 39.0–75.0)
PLATELETS: 239 10*3/uL (ref 140–400)
RBC: 5.29 10*6/uL (ref 4.20–5.82)
RDW: 12.9 % (ref 11.0–14.6)
RETIC CT ABS: 37.56 10*3/uL (ref 34.80–93.90)
Retic %: 0.71 % — ABNORMAL LOW (ref 0.80–1.80)
WBC: 4.8 10*3/uL (ref 4.0–10.3)

## 2015-06-08 LAB — COMPREHENSIVE METABOLIC PANEL (CC13)
ALT: 13 U/L (ref 0–55)
ANION GAP: 6 meq/L (ref 3–11)
AST: 21 U/L (ref 5–34)
Albumin: 4.4 g/dL (ref 3.5–5.0)
Alkaline Phosphatase: 68 U/L (ref 40–150)
BILIRUBIN TOTAL: 0.65 mg/dL (ref 0.20–1.20)
BUN: 12.9 mg/dL (ref 7.0–26.0)
CALCIUM: 9.5 mg/dL (ref 8.4–10.4)
CHLORIDE: 107 meq/L (ref 98–109)
CO2: 28 mEq/L (ref 22–29)
CREATININE: 0.8 mg/dL (ref 0.7–1.3)
EGFR: >90 mL/min/{1.73_m2} (ref >90)
Glucose: 87 mg/dl (ref 70–140)
Potassium: 3.9 mEq/L (ref 3.5–5.1)
Sodium: 141 mEq/L (ref 136–145)
TOTAL PROTEIN: 6.8 g/dL (ref 6.4–8.3)

## 2015-06-08 LAB — CHCC SMEAR

## 2015-06-08 LAB — LACTATE DEHYDROGENASE (CC13): LDH: 144 U/L (ref 125–245)

## 2015-06-08 NOTE — Progress Notes (Signed)
Frank Frederick    HEMATOLOGY/ONCOLOGY CONSULTATION NOTE  Date of Service: 06/08/2015  [name], MD as PCP - General  CHIEF COMPLAINTS/PURPOSE OF CONSULTATION:  Unprovoked VTE  HISTORY OF PRESENTING ILLNESS:  Frank Frederick is a wonderful 19 y.o. male who has been referred to [place] by Dr .[name], MD for evaluation and management of unprovoked venous thromboembolic.  Patient is a healthy 19 yo AAM with no h/o chronic medical issues.   He presented to the ED with increasing Right upper back pain on 04/19/2015 and had a CTA chest which showed bilateral subsegmental pulmonary embolism. [place] ext veins showed no evidence of lower extremity venous thrombo-embolism. He was treated with Xarelto loading dose and then has been on Xarelto 20mg  po daily with resolution of his symptoms of chest pain/ack pain. He has no shortness of breath and no overt dyspnea on exertion. No other acute new symptoms. He has been on therapeutic anticoagulation for the last 6-7 weeks and has had no bleeding issues with the anticoagulation. No FHX no venous or arterial thrombosis.  He is a student/Junior in college and notes that he has been very busy with studying and does tend to sit for long periods of time. No recent long distance travel. No recent surgeries. No symptoms suggestive of autoimmune disease.  No fevers/chills/weight loss/night sweats. No other concerning focal symptoms. Patient does not engage in contact sports or other high risk activities that might increase his risk of bleeding on therapeutic anticoagulation.   MEDICAL HISTORY:  Past Medical History  Diagnosis Date  . Medical history non-contributory     SURGICAL HISTORY: Past Surgical History  Procedure Laterality Date  . Orchiopexy  02/25/2012    Procedure: ORCHIOPEXY ADULT;  Surgeon: Marcine Matar, MD;  Location: [facility] OR;  Service: Urology;  Laterality: Bilateral;    SOCIAL HISTORY: Social History   Social  History  . Marital Status: Single    Spouse Name: N/A  . Number of Children: N/A  . Years of Education: N/A   Occupational History  . Not on file.   Social History Main Topics  . Smoking status: Never Smoker   . Smokeless tobacco: Not on file  . Alcohol Use: No  . Drug Use: No  . Sexual Activity: Not on file   Other Topics Concern  . Not on file   Social History Narrative    FAMILY HISTORY: Family History  Problem Relation Age of Onset  . Hypertension Mother     ALLERGIES:  has No Known Allergies.  MEDICATIONS:  Current Outpatient Prescriptions  Medication Sig Dispense Refill  . XARELTO 20 MG TABS tablet TAKE ONE TABLET (20 MG TOTAL) BY MOUTH DAILY.  4   No current facility-administered medications for this visit.    REVIEW OF SYSTEMS:    10 Point review of Systems was done is negative except as noted above.  PHYSICAL EXAMINATION: ECOG PERFORMANCE STATUS: 0 - Asymptomatic  . Filed Vitals:   06/08/15 1057  BP: 109/71  Pulse: 65  Resp: 18   Filed Weights   06/08/15 1057  Weight: 130 lb 11.2 oz (59.285 kg)   .Body mass index is 17.25 kg/(m^2).  GENERAL:alert, in no acute distress and comfortable SKIN: skin color, texture, turgor are normal, no rashes or significant lesions EYES: normal, conjunctiva are pink and non-injected, sclera clear OROPHARYNX:no exudate, no erythema and lips, buccal mucosa, and tongue normal  NECK: supple, no JVD, thyroid normal size, non-tender, without nodularity LYMPH:  no  palpable lymphadenopathy in the cervical, axillary or inguinal LUNGS: clear to auscultation with normal respiratory effort HEART: regular rate & rhythm,  no murmurs and no lower extremity edema ABDOMEN: abdomen soft, non-tender, normoactive bowel sounds  Musculoskeletal: no cyanosis of digits and no clubbing  PSYCH: alert & oriented x 3 with fluent speech NEURO: no focal motor/sensory deficits  LABORATORY DATA:  I have reviewed the data as  listed  . CBC Latest Ref Rng 06/08/2015 04/20/2015 04/20/2015  WBC 4.0 - 10.3 10e3/uL 4.8 6.7 9.2  Hemoglobin 13.0 - 17.1 g/dL 16.1 09.6 04.5  Hematocrit 38.4 - 49.9 % 44.6 41.0 42.0  Platelets 140 - 400 10e3/uL 239 245 264    . CMP Latest Ref Rng 04/20/2015 04/20/2015 02/24/2012  Glucose 65 - 99 mg/dL 409(W) 92 119(J)  BUN 6 - 20 mg/dL 8 13 13   Creatinine 0.61 - 1.24 mg/dL 4.78 2.95 6.21  Sodium 135 - 145 mmol/L 140 142 141  Potassium 3.5 - 5.1 mmol/L 3.8 4.9 4.0  Chloride 101 - 111 mmol/L 108 108 107  CO2 22 - 32 mmol/L 27 28 19   Calcium 8.9 - 10.3 mg/dL 9.1 9.1 9.6  Total Protein 6.5 - 8.1 g/dL 5.9(L) - 7.4  Total Bilirubin 0.3 - 1.2 mg/dL 0.8 - 0.3  Alkaline Phos 38 - 126 U/L 63 - 191(H)  AST 15 - 41 U/L 25 - 31  ALT 17 - 63 U/L 19 - 12   . Lab Results  Component Value Date   LDH 144 06/08/2015   Hypercoagulable panel, comprehensive  Status: Final result     Visible to patient:  Not Released     Next appt: 09/07/2015 at 09:30 AM in Oncology [facility] 2)     Dx:  Pulmonary embolism (HCC)                Ref Range 3wk ago (06/08/15) 8mo ago (04/20/15) 8mo ago (04/20/15)     AntiThromb III Func 80 - 120 % activity 98       Protein C Antigen 70 - 140 % 105      Comments: Units: % of normal     PROTEIN S ANTIGEN, TOTAL 70 - 140 % 89      Comments: Units: % of normal     Lupus Anticoagulant Eval  REPORT      Comments: A Lupus Anticoagulant is not detected.Common causes for a prolonged screen and negativeconfirmatory test include factor deficienciesor anticoagulant therapy.Reference Range:  Not  Detectedhttp://education.[facility].com/faq/LupusAnticoag-------------------------------------------------------This interpretation is based on the following testresults.       Beta-2 Glyco I IgG <=20 SGU <9  <9R, CM     Beta-2-Glycoprotein I IgM <=20 SMU <9  <9R, CM     Beta-2-Glycoprotein I IgA <=20 SAU 9  <9R, CM    Comments:  The Antiphospholipid Antibody Syndrome  (APS) is a clinical-pathologiccorrelation that includes a clinical event (e.g. thrombosis, pregnancyloss, thrombocytopenia) and persistent positive AntiphospholipidAntibodies (IgM or IgG ACA >40 MPL/GPL, IgM or IgG   anti-B2GPIantibodies, or a Lupus Anticoagulant). The IgA isotype has beenimplicated in smaller studies, but have not yet been incorporated intothe APS criteria. International consensus guidelines suggest waitingat least 12 weeks before retesting to  confirm antibody persistence.Reference Terese Door Haemost 2006: 4; 295 For more information on this test, go tohttp://education.[facility].com/faq/FAQ109       Anticardiolipin IgA APL <11 <9R, CM     Comments:  Value      Interpretation                               -------     --------------                             < or = 11     Negative                                12 - 20     Indeterminate                                  21 - 80     Low to Medium Positive                                   > 80     High Positive       Anticardiolipin IgG GPL <14 <9R, CM     Comments:                                  Value      Interpretation                               -------     --------------                             < or = 14     Negative                                15 - 20     Indeterminate                                  21 - 80     Low to Medium Positive                                   > 80     High Positive       Anticardiolipin IgM MPL <12 <9R, CM     Comments:                                  Value      Interpretation                               -------     --------------                             < or = 12     Negative  13 - 20     Indeterminate                                  21 - 80     Low to Medium Positive                                   > 80     High PositiveThe antiphospholipid antibody syndrome (APS) is a clinical-pathologiccorrelation that includes a  clinical event (e.g. thrombosis, pregnancyloss,  thrombocytopenia) and persistent positive antiphospholipidantibodies (IgM or IgG ACA >40 MPL/GPL, IgM or IgG anti-b2GPIantibodies or a lupus anticoagulant). The IgA isotype has beenimplicated in smaller studies, but have not yet been incorporated intothe   APS criteria. International consensus guidelines suggest waitingat least 12 weeks before retesting to confirm antibody persistence.Reference: J Thromb Haemost 2006: 4; 295       Result  REPORT      Comments: FACTOR V LEIDEN (R506Q) MUTATION NOT DETECTED     Interpretation  REPORT      Comments: This individual is negative (normal) for the Factor VLeiden (R506Q) mutation in the Factor V gene.Increased risk of thrombophilia can be caused by avariety of genetic and non-genetic factors notscreened for by this assay.     Reviewer  REPORT      Comments: Delton Coombes, Ph.D.,FACMG,Director, Molecular GeneticsSUPPLEMENTAL INFORMATIONThe Factor V Leiden (R506Q) mutation [[id number]:c.1601G>A (p.R534Q)] in the Factor V gene is one of themost common causes of inherited thrombophilia.  Thismutation   causes resistance to degradation of activatedFactor V protein by activated Protein C (APC).The Factor V Leiden (R506Q) mutation is detected byamplification of the selected region of Factor V geneby polymerase chain reaction (PCR) and fluorescentprobe  hybridization to the targeted region, followed bymelting curve analysis with a real time PCR system.Although rare, false positive or false negative resultsmay occur.  All results should be interpreted incontext of clinical findings, relevant history,  andother laboratory data.Health care providers, please contact your local[facility] genetic counselor or [phone] [phone]) for assistance withinterpretation of these results.This test was developed and its analytical   performancecharacteristics have been determined by [employer], [place], [place].It has not been cleared or approved by the U.S.Food and Drug Administration. The FDA has determinedthat such clearance or approval is not necessary.This  assay has been validated pursuant to CLIAregulations and is used for clinical purposes.       Protein C Activity 70 - 180 % 124      Comments: Units: % of normal     Protein S Activity 70 - 150 % 136       Results  REPORT      Comments: THE G20210A MUTATION NOT DETECTED     Interpretation  REPORT      Comments: This individual is negative (normal) for the G20210Amutation in the Prothrombin/Factor II gene.  Increasedrisk of thrombophilia can be caused by a variety ofgenetic and non-genetic factors not screened for bythis assay.     Reviewer  REPORT      Comments: Gillian Shields, Ph.D., FACMGDirector, Molecular GeneticsSUPPLEMENTAL INFORMATIONThe G20210A mutation [[id number]:g.21538G>A (c.*97G>A)]in the Prothrombin/Factor II gene is the second mostcommon inherited risk factor for thrombosis occurringin  approximately 2% of Caucasians.  Presence of themutation is associated with an elevation of prothrombinlevels to about 30% above normal in heterozygotes andto 70% above normal in homozygotes.The  G20210A mutation is detected by amplification ofthe  selected region of Factor II gene by polymerasechain reaction (PCR) and fluorescent probehybridization to the targeted region, followed bymelting curve analysis with a real time PCR system.Although rare, false positive or false negative resultsmay occur.    All results should be interpreted incontext of clinical findings, relevant history, andother laboratory data.Health care providers, please contact your local[facility] genetic counselor or call866-GENEINFO [phone]) for assistance  withinterpretation of these results.This test was developed and its analytical performancecharacteristics have been determined by [employer], [place], [place].It has  not been cleared or approved by the U.S.Food and Drug  Administration. The FDA has determinedthat such clearance or approval is not necessary.This assay has been validated pursuant to CLIAregulations and is used for clinical purposes.          Cardiolipin antibodies, IgG, IgM, IgA  Status: Final result     Visible to patient:  Not Released     Next appt: 09/07/2015 at 09:30 AM in Oncology [facility] 2)              Newer results are available. Click to view them now.        Ref Range 37mo ago     Anticardiolipin IgG 0 - 14 GPL U/mL <9    Comments: (NOTE)                          Negative:              <15                           Indeterminate:     15 - 20                           Low-Med Positive: >20 - 80                           High Positive:         >80      Anticardiolipin IgM 0 - 12 MPL U/mL <9    Comments: (NOTE)                          Negative:              <13                           Indeterminate:     13 - 20                           Low-Med Positive: >20 - 80                           High Positive:         >80      Anticardiolipin IgA 0 - 11 APL U/mL <9    Comments: (NOTE)                          Negative:              <12  Indeterminate:     12 - 20                           Low-Med Positive: >20 - 80                           High Positive:         >80  Performed At: [facility]  [address] [place], [place] [id number]  Mila Homer MD WU:[id number]          Prothrombin gene mutation  Status: Final result     Visible to patient:  Not Released     Next appt: 09/07/2015 at 09:30 AM in Oncology [facility] 2)              6mo ago     Recommendations-PTGENE: Comment    Comments: (NOTE)  NEGATIVE  No mutation identified.          Factor 5 leiden  Status: Edited Result - FINAL     Visible to patient:  Not Released     Next appt: 09/07/2015 at 09:30 AM in Oncology [facility] 2)               6mo ago     Recommendations-F5LEID: Comment    Comments: (NOTE)  Result: Negative (no mutation found)        Homocysteine 5.8  Beta-2-glycoprotein i abs, IgG/M/A  Status: Final result     Visible to patient:  Not Released     Next appt: 09/07/2015 at 09:30 AM in Oncology [facility] 2)              Newer results are available. Click to view them now.        Ref Range 6mo ago     Beta-2 Glyco I IgG 0 - 20 GPI IgG units <9    Comments: (NOTE)  The reference interval reflects a 3SD or 99th percentile interval,  which is thought to represent a potentially clinically significant  result in accordance with the International Consensus Statement on  the classification criteria for definitive antiphospholipid  syndrome (APS). J Thromb Haem 2006;4:295-306.      Beta-2-Glycoprotein I IgM 0 - 32 GPI IgM units <9    Comments: (NOTE)  The reference interval reflects a 3SD or 99th percentile interval,  which is thought to represent a potentially clinically significant  result in accordance with the International Consensus Statement on  the classification criteria for definitive antiphospholipid  syndrome (APS). J Thromb Haem 2006;4:295-306.  Performed At: [facility]  [address] [place], [place] [id number]  Mila Homer MD VH:[id number]      Beta-2-Glycoprotein I IgA 0 - 25 GPI IgA units <9    Comments: (NOTE)  The reference interval reflects a 3SD or 99th percentile interval,  which is thought to represent a potentially clinically significant  result in accordance with the International Consensus Statement on  the classification criteria for definitive antiphospholipid  syndrome (APS). J Thromb Haem 2006;4:295-306.             RADIOGRAPHIC STUDIES: I have personally reviewed the radiological images as listed and agreed with the findings in the report. No results found.    [place] b/l lower extremities 04/21/2015  Summary:  - No evidence of  deep vein or superficial thrombosis involving the  right lower extremity and left lower extremity. -  No evidence of Baker&'s cyst on the right or left.  Other specific details can be found in the table(s) above. Prepared and Electronically Authenticated by  Janetta Hora. Fields MD 2016-10-02T10:18:42   CT ANGIOGRAPHY CHEST WITH CONTRAST 04/20/2015  TECHNIQUE: Multidetector CT imaging of the chest was performed using the standard protocol during bolus administration of intravenous contrast. Multiplanar CT image reconstructions and MIPs were obtained to evaluate the vascular anatomy.  CONTRAST: OMNIPAQUE IOHEXOL 350 MG/ML SOLN  COMPARISON: None.  FINDINGS: THORACIC INLET/BODY WALL:  No acute abnormality.  MEDIASTINUM:  There are bilateral subsegmental pulmonary emboli seen in the right lower lobe on image 183 and likely on 207, and on the left in the lingula on image 169. No ischemic changes.  Normal heart size. No pericardial effusion. Negative aorta.  LUNG WINDOWS:  No consolidation. No effusion.  UPPER ABDOMEN:  No acute findings.  OSSEOUS:  No acute fracture. No suspicious lytic or blastic lesions.  Critical Value/emergent results were called by telephone at the time of interpretation on 04/20/2015 at 2:02 am to Dr. Cy Blamer , who verbally acknowledged these results.  Review of the MIP images confirms the above findings.  IMPRESSION: Bilateral subsegmental pulmonary embolism.   Electronically Signed  By: Marnee Spring M.D.  On: 04/20/2015 02:04   ASSESSMENT & PLAN:    19 yo otherwise healthy African American Male with   1) Unprovoked bilateral pulmonary embolism. No previous personal or family history of VTE or arterial thrombosis. No obvious provoking factor. No clinical stigmata of autoimmune disease or malignancy. No significant chronic inflammatory or infectious issues. No h/o vascular injury or central  lines in the past. Extensive hypercoagulable workup unrevealing. Plan -I had an extensive discussion with the patient and his father and mother regarding the finding of his workup -we discussed that in about 50% of unprovoked events an obvious risk factor might not be identified despite extensive workup -we discussed that the risk of recurrent VTE is certainly elevated based on first event but the exact risk is difficult to define in the absence of a clearly defined thrombophilia. -he will certainly need atleast 6months of therapeutic antocoagulation. -we will followup with him in 3 months as he gets close to completing his therapeutic anticoagulation and will discussion options of transitioning to Aspirin vs dose reduced Eliquis vs continued therapeutic anticoagulation. Might use d dimer for prognostication if normal. -will rpt APLA workup on next visit. -watch clinical for any evident risk factors declared with time.  RTC in 3 months with cbc, cmp, d dimer APLA workup, ANA with reflex  All of the patients questions were answered with apparent satisfaction. The patient knows to call the clinic with any problems, questions or concerns.   I spent 55 minutes counseling the patient face to face. The total time spent in the appointment was 70 minutes and more than 50% was on counseling and direct patient cares.    Wyvonnia Lora MD MS AAHIVMS [facility] Hematology/Oncology Physician [facility]  (Office):       [phone] (Work cell):  [phone] (Fax):           [phone]  06/08/2015 11:20 AM

## 2015-06-08 NOTE — Telephone Encounter (Signed)
Gave patient avs report and appointments for February 2017.  °

## 2015-06-18 LAB — HYPERCOAGULABLE PANEL, COMPREHENSIVE
AntiThromb III Func: 98 % activity (ref 80–120)
Anticardiolipin IgA: <11 [APL'U]
Beta-2-Glycoprotein I IgA: 9 SAU (ref <=20)
Beta-2-Glycoprotein I IgM: <9 SMU (ref <=20)
PROTEIN C ANTIGEN: 105 % (ref 70–140)
PROTEIN S ACTIVITY: 136 % (ref 70–150)
PROTEIN S ANTIGEN, TOTAL: 89 % (ref 70–140)
Protein C Activity: 124 % (ref 70–180)

## 2015-06-18 LAB — RFX DRVVT SCR W/RFLX CONF 1:1 MIX: DRVVT SCREEN: 52 s — AB (ref <=45)

## 2015-06-18 LAB — SEDIMENTATION RATE: Sed Rate: 1 mm/hr (ref 0–15)

## 2015-06-18 LAB — RFLX HEXAGONAL PHASE CONFIRM: Hexagonal Phase Confirm: NEGATIVE

## 2015-06-18 LAB — RFX PTT-LA W/RFX TO HEX PHASE CONF: PTT-LA Screen: 45 s — ABNORMAL HIGH (ref <=40)

## 2015-06-18 LAB — RFLX DRVVT CONFRIM: DRVVT CONFIRMATION: NEGATIVE

## 2015-06-18 LAB — C-REACTIVE PROTEIN

## 2015-09-04 ENCOUNTER — Encounter (HOSPITAL_BASED_OUTPATIENT_CLINIC_OR_DEPARTMENT_OTHER): Payer: Self-pay | Admitting: *Deleted

## 2015-09-04 ENCOUNTER — Emergency Department (HOSPITAL_BASED_OUTPATIENT_CLINIC_OR_DEPARTMENT_OTHER)
Admission: EM | Admit: 2015-09-04 | Discharge: 2015-09-04 | Disposition: A | Discharge status: Home / Self Care | Payer: BLUE CROSS/BLUE SHIELD | Attending: Emergency Medicine | Admitting: Emergency Medicine

## 2015-09-04 DIAGNOSIS — IMO0001 Reserved for inherently not codable concepts without codable children: Secondary | ICD-10-CM

## 2015-09-04 DIAGNOSIS — Z79899 Other long term (current) drug therapy: Secondary | ICD-10-CM | POA: Insufficient documentation

## 2015-09-04 DIAGNOSIS — K219 Gastro-esophageal reflux disease without esophagitis: Secondary | ICD-10-CM | POA: Insufficient documentation

## 2015-09-04 DIAGNOSIS — Z86711 Personal history of pulmonary embolism: Secondary | ICD-10-CM | POA: Diagnosis not present

## 2015-09-04 DIAGNOSIS — R079 Chest pain, unspecified: Secondary | ICD-10-CM | POA: Diagnosis present

## 2015-09-04 HISTORY — DX: Other pulmonary embolism without acute cor pulmonale: I26.99

## 2015-09-04 MED ORDER — OMEPRAZOLE 20 MG PO CPDR: 20.0000 mg | DELAYED_RELEASE_CAPSULE | Freq: Every day | ORAL | Status: DC

## 2015-09-04 MED FILL — OMEPRAZOLE DR 20 MG CAPSULE: 20 | 20 days supply | Qty: 20 | Fill #0

## 2015-09-04 NOTE — ED Notes (Addendum)
Sharp pulling in his left chest for 2 months he was diagnosed with a pulmonary embolism.

## 2015-09-04 NOTE — ED Provider Notes (Signed)
CSN: [id number]     Arrival date & time 09/04/15  1140 History   First MD Initiated Contact with Patient 09/04/15 1233     Chief Complaint  Patient presents with  . Chest Pain      HPI Patient presents with a sensation in his epigastric area been present off and on for last few months.  This is not similar to his PE which was pain in the right upper back area.  He is on his overall toe and takes it religiously.  He denies shortness of breath. Past Medical History  Diagnosis Date  . Medical history non-contributory   . PE (pulmonary embolism)    Past Surgical History  Procedure Laterality Date  . Orchiopexy  02/25/2012    Procedure: ORCHIOPEXY ADULT;  Surgeon: Marcine Matar, MD;  Location: [facility] OR;  Service: Urology;  Laterality: Bilateral;   Family History  Problem Relation Age of Onset  . Hypertension Mother    Social History  Substance Use Topics  . Smoking status: Never Smoker   . Smokeless tobacco: None  . Alcohol Use: No    Review of Systems  All other systems reviewed and are negative.     Allergies  Review of patient's allergies indicates no known allergies.  Home Medications   Prior to Admission medications   Medication Sig Start Date End Date Taking? Authorizing Provider  XARELTO 20 MG TABS tablet TAKE ONE TABLET (20 MG TOTAL) BY MOUTH DAILY. 05/19/15   Historical Provider, MD   BP 113/63 mmHg  Pulse 72  Temp(Src) 98.6 F (37 C) (Oral)  Resp 18  Ht  (1.854 m)  Wt 130 lb (58.968 kg)  BMI 17.16 kg/m2  SpO2 100% Physical Exam Physical Exam  Nursing note and vitals reviewed. Constitutional: He is oriented to person, place, and time. He appears well-developed and well-nourished. No distress.  HENT:  Head: Normocephalic and atraumatic.  Eyes: Pupils are equal, round, and reactive to light.  Neck: Normal range of motion.  Cardiovascular: Normal rate and intact distal pulses.   Pulmonary/Chest: No respiratory distress.  Abdominal: Normal  appearance. He exhibits no distension.  Musculoskeletal: Normal range of motion.  Neurological: He is alert and oriented to person, place, and time. No cranial nerve deficit.  Skin: Skin is warm and dry. No rash noted.   ED Course  Procedures (including critical care time) Labs Review Labs Reviewed - No data to display  Imaging Review No results found. I have personally reviewed and evaluated these images and lab results as part of my medical decision-making.   EKG Interpretation   Date/Time:  Tuesday September 04 2015 11:51:49 EST Ventricular Rate:  74 PR Interval:  136 QRS Duration: 102 QT Interval:  344 QTC Calculation: 381 R Axis:   45 Text Interpretation:  Normal sinus rhythm Incomplete right bundle branch  block Borderline ECG No significant change since last tracing Confirmed by  Nike Southwell  MD, Diya Gervasi ([id number]) on 09/04/2015 12:43:21 PM     I discussed with Dr. Candise Che from oncology.  Patient has pulse rate in the 70s and a pulse oximetry between 96 and 100% on room air.  No tachypnea.  High unlikely this is a pulmonary embolism.  He is starting on anticoagulants.  Symptoms more likely resulting from reflux.  Will follow-up with Dr. Candise Che is scheduled this Friday.  We'll discharge on Prilosec. MDM   Final diagnoses:  None        Nelva Nay, MD 09/04/15 1423

## 2015-09-04 NOTE — Discharge Instructions (Signed)
Gastroesophageal Reflux Disease, Adult Normally, food travels down the esophagus and stays in the stomach to be digested. If a person has gastroesophageal reflux disease (GERD), food and stomach acid move back up into the esophagus. When this happens, the esophagus becomes sore and swollen (inflamed). Over time, GERD can make small holes (ulcers) in the lining of the esophagus. HOME CARE Diet  Follow a diet as told by your doctor. You may need to avoid foods and drinks such as:  Coffee and tea (with or without caffeine).  Drinks that contain alcohol.  Energy drinks and sports drinks.  Carbonated drinks or sodas.  Chocolate and cocoa.  Peppermint and mint flavorings.  Garlic and onions.  Horseradish.  Spicy and acidic foods, such as peppers, chili powder, curry powder, vinegar, hot sauces, and BBQ sauce.  Citrus fruit juices and citrus fruits, such as oranges, lemons, and limes.  Tomato-based foods, such as red sauce, chili, salsa, and pizza with red sauce.  Fried and fatty foods, such as donuts, french fries, potato chips, and high-fat dressings.  High-fat meats, such as hot dogs, rib eye steak, sausage, ham, and bacon.  High-fat dairy items, such as whole milk, butter, and cream cheese.  Eat small meals often. Avoid eating large meals.  Avoid drinking large amounts of liquid with your meals.  Avoid eating meals during the 2-3 hours before bedtime.  Avoid lying down right after you eat.  Do not exercise right after you eat. General Instructions  Pay attention to any changes in your symptoms.  Take over-the-counter and prescription medicines only as told by your doctor. Do not take aspirin, ibuprofen, or other NSAIDs unless your doctor says it is okay.  Do not use any tobacco products, including cigarettes, chewing tobacco, and e-cigarettes. If you need help quitting, ask your doctor.  Wear loose clothes. Do not wear anything tight around your waist.  Raise  (elevate) the head of your bed about 6 inches (15 cm).  Try to lower your stress. If you need help doing this, ask your doctor.  If you are overweight, lose an amount of weight that is healthy for you. Ask your doctor about a safe weight loss goal.  Keep all follow-up visits as told by your doctor. This is important. GET HELP IF:  You have new symptoms.  You lose weight and you do not know why it is happening.  You have trouble swallowing, or it hurts to swallow.  You have wheezing or a cough that keeps happening.  Your symptoms do not get better with treatment.  You have a hoarse voice. GET HELP RIGHT AWAY IF:  You have pain in your arms, neck, jaw, teeth, or back.  You feel sweaty, dizzy, or light-headed.  You have chest pain or shortness of breath.  You throw up (vomit) and your throw up looks like blood or coffee grounds.  You pass out (faint).  Your poop (stool) is bloody or black.  You cannot swallow, drink, or eat.   This information is not intended to replace advice given to you by your health care provider. Make sure you discuss any questions you have with your health care provider.   Document Released: 12/24/2007 Document Revised: 03/28/2015 Document Reviewed: 11/01/2014 Elsevier Interactive Patient Education 2016 [employer] Inc[employer]  -   

## 2015-09-04 NOTE — ED Notes (Signed)
MD at bedside. 

## 2015-09-04 NOTE — ED Notes (Signed)
Patient changed into a gown, placed on cardiac monitor, blood pressure cycling every 30 minutes.

## 2015-09-04 NOTE — ED Notes (Signed)
Calling Dr. Tami Ribas at [facility] via carelink-----spoke Gala Romney

## 2015-09-07 ENCOUNTER — Other Ambulatory Visit: Payer: Self-pay | Admitting: *Deleted

## 2015-09-07 ENCOUNTER — Encounter: Payer: Self-pay | Admitting: Hematology

## 2015-09-07 ENCOUNTER — Telehealth: Payer: Self-pay | Admitting: Hematology

## 2015-09-07 ENCOUNTER — Other Ambulatory Visit (HOSPITAL_BASED_OUTPATIENT_CLINIC_OR_DEPARTMENT_OTHER): Payer: BLUE CROSS/BLUE SHIELD

## 2015-09-07 ENCOUNTER — Ambulatory Visit (HOSPITAL_BASED_OUTPATIENT_CLINIC_OR_DEPARTMENT_OTHER): Payer: BLUE CROSS/BLUE SHIELD | Admitting: Hematology

## 2015-09-07 VITALS — BP 94/67 | HR 91 | Temp 97.6°F | Resp 17 | Ht 73.0 in | Wt 129.1 lb

## 2015-09-07 DIAGNOSIS — I2699 Other pulmonary embolism without acute cor pulmonale: Secondary | ICD-10-CM

## 2015-09-07 DIAGNOSIS — D6859 Other primary thrombophilia: Secondary | ICD-10-CM

## 2015-09-07 LAB — CBC & DIFF AND RETIC
BASO%: 0.3 % (ref 0.0–2.0)
BASOS ABS: 0 10*3/uL (ref 0.0–0.1)
EOS ABS: 0.1 10*3/uL (ref 0.0–0.5)
EOS%: 0.7 % (ref 0.0–7.0)
HEMATOCRIT: 46.1 % (ref 38.4–49.9)
HEMOGLOBIN: 15.5 g/dL (ref 13.0–17.1)
IMMATURE RETIC FRACT: 2.1 % — AB (ref 3.00–10.60)
LYMPH%: 11.6 % — AB (ref 14.0–49.0)
MCH: 28.5 pg (ref 27.2–33.4)
MCHC: 33.6 g/dL (ref 32.0–36.0)
MCV: 84.7 fL (ref 79.3–98.0)
MONO#: 0.9 10*3/uL (ref 0.1–0.9)
MONO%: 13.3 % (ref 0.0–14.0)
NEUT#: 5.1 10*3/uL (ref 1.5–6.5)
NEUT%: 74.1 % (ref 39.0–75.0)
Platelets: 267 10*3/uL (ref 140–400)
RBC: 5.44 10*6/uL (ref 4.20–5.82)
RDW: 13.4 % (ref 11.0–14.6)
RETIC %: 0.84 % (ref 0.80–1.80)
Retic Ct Abs: 45.7 10*3/uL (ref 34.80–93.90)
WBC: 6.8 10*3/uL (ref 4.0–10.3)
lymph#: 0.8 10*3/uL — ABNORMAL LOW (ref 0.9–3.3)

## 2015-09-07 LAB — COMPREHENSIVE METABOLIC PANEL
ALBUMIN: 4.3 g/dL (ref 3.5–5.0)
ALK PHOS: 61 U/L (ref 40–150)
ALT: 13 U/L (ref 0–55)
ANION GAP: 8 meq/L (ref 3–11)
AST: 18 U/L (ref 5–34)
BILIRUBIN TOTAL: 1.13 mg/dL (ref 0.20–1.20)
BUN: 12.4 mg/dL (ref 7.0–26.0)
CALCIUM: 9.2 mg/dL (ref 8.4–10.4)
CO2: 28 mEq/L (ref 22–29)
Chloride: 104 mEq/L (ref 98–109)
Creatinine: 0.8 mg/dL (ref 0.7–1.3)
GLUCOSE: 83 mg/dL (ref 70–140)
Potassium: 4 mEq/L (ref 3.5–5.1)
SODIUM: 140 meq/L (ref 136–145)
TOTAL PROTEIN: 7.2 g/dL (ref 6.4–8.3)

## 2015-09-07 NOTE — Telephone Encounter (Signed)
Pt confirmed labs/ov per 02/17 POF, gave pt AVS and Calendar... KJ °

## 2015-09-08 LAB — SEDIMENTATION RATE: Sedimentation Rate-Westergren: 2 mm/hr (ref 0–15)

## 2015-09-08 LAB — D-DIMER, QUANTITATIVE: D-DIMER: <0.2 mg/L FEU (ref 0.00–0.49)

## 2015-09-08 NOTE — Progress Notes (Signed)
Frank Frederick    HEMATOLOGY/ONCOLOGY CONSULTATION NOTE  Date of Service: .09/07/2015  Patient Care Team: Frank Ora, MD as PCP - General (Family Medicine)  CHIEF COMPLAINTS/PURPOSE OF CONSULTATION:  Unprovoked b/l pulmonary embolism  HISTORY OF PRESENTING ILLNESS: plz see my initial consultation for details on initial presentation  INTERVAL HISTORY  [name] is here for his scheduled followup. He is accompanied by his parents. He has been compliant with his Xarelto and noted no issues with bleeding. He has completed about 4.5 months. Had visited the ED for some chest wall discomfort that sounds MSK. He had no SOB/tachycardia or pleuritic aspect to her chest discomfort which has since resolved. 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 He had follow up labs today including rpt d dimer levels and APLA panel with regards to deciding on need for long term anticoagulation.  MEDICAL HISTORY:  Past Medical History  Diagnosis Date  . Medical history non-contributory   . PE (pulmonary embolism)     SURGICAL HISTORY: Past Surgical History  Procedure Laterality Date  . Orchiopexy  02/25/2012    Procedure: ORCHIOPEXY ADULT;  Surgeon: Marcine Matar, MD;  Location: [facility] OR;  Service: Urology;  Laterality: Bilateral;    SOCIAL HISTORY: Social History   Social History  . Marital Status: Single    Spouse Name: N/A  . Number of Children: N/A  . Years of Education: N/A   Occupational History  . Not on file.   Social History Main Topics  . Smoking status: Never Smoker   . Smokeless tobacco: Not on file  . Alcohol Use: No  . Drug Use: No  . Sexual Activity: Not on file   Other Topics Concern  . Not on file   Social History Narrative    FAMILY HISTORY: Family History  Problem Relation Age of Onset  . Hypertension Mother     ALLERGIES:  has No Known Allergies.  MEDICATIONS:  Current Outpatient Prescriptions  Medication Sig Dispense Refill  . omeprazole (PRILOSEC) 20 MG  capsule Take 1 capsule (20 mg total) by mouth daily. 20 capsule 0  . XARELTO 20 MG TABS tablet TAKE ONE TABLET (20 MG TOTAL) BY MOUTH DAILY.  4   No current facility-administered medications for this visit.    REVIEW OF SYSTEMS:    10 Point review of Systems was done is negative except as noted above.  PHYSICAL EXAMINATION: ECOG PERFORMANCE STATUS: 0 - Asymptomatic  . Filed Vitals:   09/07/15 1017  BP: 94/67  Pulse: 91  Temp: 97.6 F (36.4 C)  Resp: 17   Filed Weights   09/07/15 1017  Weight: 129 lb 1.6 oz (58.559 kg)   .Body mass index is 17.04 kg/(m^2).  GENERAL:alert, in no acute distress and comfortable SKIN: skin color, texture, turgor are normal, no rashes or significant lesions EYES: normal, conjunctiva are pink and non-injected, sclera clear OROPHARYNX:no exudate, no erythema and lips, buccal mucosa, and tongue normal  NECK: supple, no JVD, thyroid normal size, non-tender, without nodularity LYMPH:  no palpable lymphadenopathy in the cervical, axillary or inguinal LUNGS: clear to auscultation with normal respiratory effort HEART: regular rate & rhythm,  no murmurs and no lower extremity edema ABDOMEN: abdomen soft, non-tender, normoactive bowel sounds  Musculoskeletal: no cyanosis of digits and no clubbing  PSYCH: alert & oriented x 3 with fluent speech NEURO: no focal motor/sensory deficits  LABORATORY DATA:  I have reviewed the data as listed  . CBC Latest Ref Rng 09/07/2015 06/08/2015 04/20/2015  WBC 4.0 - 10.3 10e3/uL 6.8 4.8 6.7  Hemoglobin 13.0 - 17.1 g/dL 16.1 09.6 04.5  Hematocrit 38.4 - 49.9 % 46.1 44.6 41.0  Platelets 140 - 400 10e3/uL 267 239 245    . CMP Latest Ref Rng 09/07/2015 06/08/2015 04/20/2015  Glucose 70 - 140 mg/dl 83 87 409(W)  BUN 7.0 - 26.0 mg/dL 11.9 14.7 8  Creatinine 0.7 - 1.3 mg/dL 0.8 0.8 8.29  Sodium 562 - 145 mEq/L 140 141 140  Potassium 3.5 - 5.1 mEq/L 4.0 3.9 3.8  Chloride 101 - 111 mmol/L - - 108  CO2 22 - 29 mEq/L Calcium 8.4 - 10.4 mg/dL 9.2 9.5 9.1  Total Protein 6.4 - 8.3 g/dL 7.2 6.8 5.9(L)  Total Bilirubin 0.20 - 1.20 mg/dL 1.30 8.65 0.8  Alkaline Phos 40 - 150 U/L 61 68 63  AST 5 - 34 U/L ALT 0 - 55 U/L . Lab Results  Component Value Date   LDH 144 06/08/2015   D- dimer 09/07/2015: <0.20 Sed rate 2  ASSESSMENT & PLAN:    20 yo otherwise healthy African American Male with   1) Unprovoked bilateral pulmonary embolism. No previous personal or family history of VTE or arterial thrombosis. No obvious provoking factor. No clinical stigmata of autoimmune disease or malignancy. No significant chronic inflammatory or infectious issues. No h/o vascular injury or central lines in the past. Extensive hypercoagulable workup unrevealing. Patient follow-up labs today are WNL and his d-dimer has completely normalized and would serve as a good baseline to monitor trend if patient did decide to go off therapeutic anticoagulation and wanted to have an additional marker for increasing thrombotic risk off anticoagulation. Plan -patient has uneventfully completed about 4.5 months of therapeutic anticoagulation and has no symptoms suggestive of new VTE in the interim -rpt APLA workup sent today. -if APLA workup neg we have discussed options of transitioning to Aspirin vs dose reduced Eliquis vs continued therapeutic anticoagulation.  -d dimer might be used for prognostication if needed since it was noted to be normal on labs today -watch clinically for any evident risk factors declared with time.(no overt new risk factors identified since last clinic visit) -provided advised on general VTE prevention strategies  RTC in about 1 month to make final decision regarding long term anticoagulation strategy.  All of the patients questions were answered with apparent satisfaction. The patient knows to call the clinic with any problems, questions or concerns.   I spent 15 minutes  counseling the patient face to face. The total time spent in the appointment was 20 minutes and more than 50% was on counseling and direct patient cares.    Wyvonnia Lora MD MS AAHIVMS [facility] Hematology/Oncology Physician [facility]  (Office):       [phone] (Work cell):  [phone] (Fax):           [phone]

## 2015-09-10 LAB — LUPUS ANTICOAGULANT PANEL
DRVVT CONFIRM: 1.6 ratio — AB (ref 0.8–1.2)
Hexagonal Phase Phospholipid: 7 s (ref 0–11)
PTT-LA MIX: 51.6 s — AB (ref 0.0–40.6)
PTT-LA: 45.7 s — ABNORMAL HIGH (ref 0.0–40.6)
dRVVT Mix: 57.4 s — ABNORMAL HIGH (ref 0.0–44.0)
dRVVT: 96.8 s — ABNORMAL HIGH (ref 0.0–44.0)

## 2015-09-10 LAB — CARDIOLIPIN ANTIBODIES, IGG, IGM, IGA

## 2015-09-11 LAB — BETA-2-GLYCOPROTEIN I ABS, IGG/M/A: Beta-2 Glycoprotein I Ab, IgG: <9 GPI IgG units (ref 0–20)

## 2015-10-03 ENCOUNTER — Telehealth: Payer: Self-pay | Admitting: Hematology

## 2015-10-03 NOTE — Telephone Encounter (Signed)
lvm for pt sched with wrong provider.[name].[name]..[name]

## 2015-10-08 ENCOUNTER — Ambulatory Visit: Payer: BLUE CROSS/BLUE SHIELD | Admitting: Hematology and Oncology

## 2015-10-08 ENCOUNTER — Other Ambulatory Visit: Payer: BLUE CROSS/BLUE SHIELD

## 2015-10-10 ENCOUNTER — Encounter: Payer: Self-pay | Admitting: Hematology

## 2015-10-10 ENCOUNTER — Other Ambulatory Visit: Payer: BLUE CROSS/BLUE SHIELD

## 2015-10-10 ENCOUNTER — Ambulatory Visit (HOSPITAL_BASED_OUTPATIENT_CLINIC_OR_DEPARTMENT_OTHER): Payer: BLUE CROSS/BLUE SHIELD | Admitting: Hematology

## 2015-10-10 ENCOUNTER — Telehealth: Payer: Self-pay | Admitting: Hematology

## 2015-10-10 VITALS — BP 97/57 | HR 91 | Temp 97.7°F | Resp 18 | Ht 73.0 in | Wt 131.8 lb

## 2015-10-10 DIAGNOSIS — I2699 Other pulmonary embolism without acute cor pulmonale: Secondary | ICD-10-CM

## 2015-10-10 NOTE — Telephone Encounter (Signed)
Gave patient avs report and appointments for June.  °

## 2015-10-11 NOTE — Progress Notes (Signed)
[name]    HEMATOLOGY/ONCOLOGY CLINIC NOTE  Date of Service: .10/10/2015   Patient Care Team: Frank Ora, MD as PCP - General (Family Medicine)  CHIEF COMPLAINTS/PURPOSE OF CONSULTATION:  Unprovoked b/l pulmonary embolism  HISTORY OF PRESENTING ILLNESS: plz see my initial consultation for details on initial presentation  INTERVAL HISTORY  [name] is here for his scheduled followup. He has completed 6 months of anticoagulation for his unprovoked pulmonary embolism. 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   I discussed in detail what the presence of this antibody means, the need for confirmation of persistence of the antibody in 3 months to determine if it is significant and variability in the spectrum of manifestations with the presence of this antibody. He notes no new chest pain or shortness of breath.  No other acute new symptoms.   MEDICAL HISTORY:  Past Medical History  Diagnosis Date  . Medical history non-contributory   . PE (pulmonary embolism)     SURGICAL HISTORY: Past Surgical History  Procedure Laterality Date  . Orchiopexy  02/25/2012    Procedure: ORCHIOPEXY ADULT;  Surgeon: Marcine Matar, MD;  Location: [facility] OR;  Service: Urology;  Laterality: Bilateral;    SOCIAL HISTORY: Social History   Social History  . Marital Status: Single    Spouse Name: N/A  . Number of Children: N/A  . Years of Education: N/A   Occupational History  . Not on file.   Social History Main Topics  . Smoking status: Never Smoker   . Smokeless tobacco: Not on file  . Alcohol Use: No  . Drug Use: No  . Sexual Activity: Not on file   Other Topics Concern  . Not on file   Social History Narrative    FAMILY HISTORY: Family History  Problem Relation Age of Onset  . Hypertension Mother     ALLERGIES:  has No Known Allergies.  MEDICATIONS:  Current Outpatient Prescriptions  Medication Sig Dispense Refill  . omeprazole  (PRILOSEC) 20 MG capsule Take 1 capsule (20 mg total) by mouth daily. 20 capsule 0  . XARELTO 20 MG TABS tablet TAKE ONE TABLET (20 MG TOTAL) BY MOUTH DAILY.  4   No current facility-administered medications for this visit.    REVIEW OF SYSTEMS:    10 Point review of Systems was done is negative except as noted above.  PHYSICAL EXAMINATION: ECOG PERFORMANCE STATUS: 0 - Asymptomatic  . Filed Vitals:   10/10/15 1450  BP: 97/57  Pulse: 91  Temp: 97.7 F (36.5 C)  Resp: 18   Filed Weights   10/10/15 1450  Weight: 131 lb 12.8 oz (59.784 kg)   .Body mass index is 17.39 kg/(m^2).  GENERAL:alert, in no acute distress and comfortable SKIN: skin color, texture, turgor are normal, no rashes or significant lesions EYES: normal, conjunctiva are pink and non-injected, sclera clear OROPHARYNX:no exudate, no erythema and lips, buccal mucosa, and tongue normal  NECK: supple, no JVD, thyroid normal size, non-tender, without nodularity LYMPH:  no palpable lymphadenopathy in the cervical, axillary or inguinal LUNGS: clear to auscultation with normal respiratory effort HEART: regular rate & rhythm,  no murmurs and no lower extremity edema ABDOMEN: abdomen soft, non-tender, normoactive bowel sounds  Musculoskeletal: no cyanosis of digits and no clubbing  PSYCH: alert & oriented x 3 with fluent speech NEURO: no focal motor/sensory deficits  LABORATORY DATA:  I have reviewed the data as listed  . CBC Latest Ref Rng  09/07/2015 06/08/2015 04/20/2015  WBC 4.0 - 10.3 10e3/uL 6.8 4.8 6.7  Hemoglobin 13.0 - 17.1 g/dL 25.315.5 66.415.0 40.314.0  Hematocrit 38.4 - 49.9 % 46.1 44.6 41.0  Platelets 140 - 400 10e3/uL 267 239 245    . CMP Latest Ref Rng 09/07/2015 06/08/2015 04/20/2015  Glucose 70 - 140 mg/dl 83 87 474(Q107(H)  BUN 7.0 - 26.0 mg/dL 59.512.4 63.812.9 8  Creatinine 0.7 - 1.3 mg/dL 0.8 0.8 7.560.66  Sodium 433136 - 145 mEq/L 140 141 140  Potassium 3.5 - 5.1 mEq/L 4.0 3.9 3.8  Chloride 101 - 111 mmol/L - - 108  CO2  22 - 29 mEq/L 28 28 27   Calcium 8.4 - 10.4 mg/dL 9.2 9.5 9.1  Total Protein 6.4 - 8.3 g/dL 7.2 6.8 5.9(L)  Total Bilirubin 0.20 - 1.20 mg/dL 2.951.13 1.880.65 0.8  Alkaline Phos 40 - 150 U/L 61 68 63  AST 5 - 34 U/L 18 21 25   ALT 0 - 55 U/L 13 13 19    . Lab Results  Component Value Date   LDH 144 06/08/2015   D- dimer 09/07/2015: <0.20 Sed rate 2  Component     Latest Ref Rng 09/07/2015  PTT-LA     0.0 - 40.6 sec 45.7 (H)  PTT-LA Mix     0.0 - 40.6 sec 51.6 (H)  Hexagonal Phase Phospholipid     0 - 11 sec 7  DRVVT     0.0 - 44.0 sec 96.8 (H)  dRVVT Mix     0.0 - 44.0 sec 57.4 (H)  dRVVT Confirm     0.8 - 1.2 ratio 1.6 (H)  Lupus Anticoag Interp      Comment:  Anticardiolipin Ab,IgG,Qn     0 - 14 GPL U/mL <9  Anticardiolipin Ab,IgM,Qn     0 - 12 MPL U/mL <9  Anticardiolipin Ab,IgA,Qn     0 - 11 APL U/mL <9  Beta-2 Glycoprotein I Ab, IgG     0 - 20 GPI IgG units <9  Beta-2 Glyco 1 IgA     0 - 25 GPI IgA units <9  Beta-2 Glyco 1 IgM     0 - 32 GPI IgM units <9   Lupus anticoagulant panel      No reference range information available      Comments:           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           NOTE: Only persistent lupus anticoagulants are thought to be           of clinical           significance. For this reason, repeat testing in 12 or more            weeks after an           initial positive result should be considered to confirm or            refute the           presence of a lupus anticoagulant   ASSESSMENT & PLAN:    20 yo otherwise healthy African American Male with   1) Unprovoked bilateral pulmonary embolism. No previous personal or family history of VTE or arterial thrombosis. No obvious provoking factor. No clinical stigmata of autoimmune disease or malignancy. No significant chronic inflammatory or infectious issues. No h/o vascular injury or  central lines in the past. Extensive hypercoagulable  workup unrevealing when done on initial presentation.  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 The significance/persistence of this antibody needs to be confirmed with rpt testing in 12 weeks. Plan -patient has completed 6 months of therapeutic anticoagulation and is currently completely asymptomatic and feels well.  No issues with bleeding on anticoagulation. Expresses an interest in wanting to be off anticoagulation if possible. -We discussed the findings of the lupus anticoagulant antibody with him and his parents today in clinic.  We discussed the it is important to repeat the antibiotic testing in 3 months to determine if it is persistent or just an incidental finding.  If lupus anticoagulant antibody has persistent in the right clinical setting with otherwise unexplained thrombosis one would be concerned about the possibility of antiphospholipid antibody syndrome which would be an acquired thrombophilia that increases the risk of both arterial and venous thrombosis. -Irrespective of the lupus anticoagulant antibody and an probable blood clot in the male certainly warrants consideration for long-term anticoagulation due to elevated risk of repeat thrombosis. -Persistent lupus anticoagulant antibody would allow for a more definable risk of recurrent thrombosis which might not only be venous but also arterial. -I expressed clearly to the patient and his parents that most hematologists in this setting of unprovoked pulmonary embolisms and concern  High risk of recurrent events would recommend ongoing anticoagulation and that is what would be  My first preference. -If the patient/family chooses to get off anticoagulation I would recommend at least being on a full dose aspirin and repeating a d-dimer in 4 weeks off anticoagulation to determine if that is elevating into abnormal territory since this might be used as an early marker for elevated risk of thrombosis and  reconsideration of therapeutic anticoagulation. -Whether the patient would want to wait for 3 months to  Determine if the lupus anticoagulant persists and if that would have a bearing on his decision was discussed.  Patient and his parents would like some time to think about this which is quite reasonable.  He still has enough Rivaroxaban for about 2-3 weeks and was encouraged to let us know what his decision is in the next week or so. He has also been afforded the option of getting a second opinion.  I would recommend [facility] if he chooses to do this.  Return to care with Dr. Candise Che in 3 months with rpt cbc, cmp and lupus anticoagulant. Earlier in 1 months with rpt cbc, d-dimer if patient chooses to transition to Aspirin from Xarelto.  All of the patients and his family's questions were answered to their apparent satisfaction. The patient knows to call the clinic with any problems, questions or concerns.   I spent 25 minutes counseling the patient face to face. The total time spent in the appointment was 25 minutes and more than 50% was on counseling and direct patient cares.    Wyvonnia Lora MD MS AAHIVMS [facility] Hematology/Oncology Physician [facility]  (Office):       [phone] (Work cell):  [phone] (Fax):           [phone]

## 2015-11-30 DIAGNOSIS — R760 Raised antibody titer: Secondary | ICD-10-CM | POA: Insufficient documentation

## 2016-01-08 ENCOUNTER — Telehealth: Payer: Self-pay | Admitting: *Deleted

## 2016-01-08 NOTE — Telephone Encounter (Signed)
Received call from pt's mother stating that pt has an appt on thurs 01/10/16 & is asking for clarity regarding stopping some medication before OV.  Per note pt is on xarelto & discussed coming off xarelto & going on ASA.  Per mother pt is staying on xarelto.  Informed that there shouldn't be any reason to stop xarelto before lab or MD visit but will clarify with Dr. Richardo HanksKale/RN.  Message forwarded.

## 2016-01-10 ENCOUNTER — Encounter: Payer: Self-pay | Admitting: Hematology

## 2016-01-10 ENCOUNTER — Ambulatory Visit (HOSPITAL_BASED_OUTPATIENT_CLINIC_OR_DEPARTMENT_OTHER): Payer: BLUE CROSS/BLUE SHIELD | Admitting: Hematology

## 2016-01-10 ENCOUNTER — Other Ambulatory Visit (HOSPITAL_BASED_OUTPATIENT_CLINIC_OR_DEPARTMENT_OTHER): Payer: BLUE CROSS/BLUE SHIELD

## 2016-01-10 ENCOUNTER — Telehealth: Payer: Self-pay | Admitting: Hematology

## 2016-01-10 VITALS — BP 116/73 | HR 65 | Temp 98.4°F | Resp 16 | Wt 134.8 lb

## 2016-01-10 DIAGNOSIS — I2699 Other pulmonary embolism without acute cor pulmonale: Secondary | ICD-10-CM | POA: Diagnosis not present

## 2016-01-10 DIAGNOSIS — D6859 Other primary thrombophilia: Secondary | ICD-10-CM

## 2016-01-10 LAB — CBC & DIFF AND RETIC
BASO%: 0.5 % (ref 0.0–2.0)
BASOS ABS: 0 10*3/uL (ref 0.0–0.1)
EOS%: 2.9 % (ref 0.0–7.0)
Eosinophils Absolute: 0.2 10*3/uL (ref 0.0–0.5)
HEMATOCRIT: 45.1 % (ref 38.4–49.9)
HGB: 15.3 g/dL (ref 13.0–17.1)
IMMATURE RETIC FRACT: 2.9 % — AB (ref 3.00–10.60)
LYMPH%: 39.2 % (ref 14.0–49.0)
MCH: 28.1 pg (ref 27.2–33.4)
MCHC: 33.9 g/dL (ref 32.0–36.0)
MCV: 82.9 fL (ref 79.3–98.0)
MONO#: 0.5 10*3/uL (ref 0.1–0.9)
MONO%: 7.8 % (ref 0.0–14.0)
NEUT#: 2.9 10*3/uL (ref 1.5–6.5)
NEUT%: 49.6 % (ref 39.0–75.0)
PLATELETS: 229 10*3/uL (ref 140–400)
RBC: 5.44 10*6/uL (ref 4.20–5.82)
RDW: 12.9 % (ref 11.0–14.6)
RETIC CT ABS: 36.99 10*3/uL (ref 34.80–93.90)
Retic %: 0.68 % — ABNORMAL LOW (ref 0.80–1.80)
WBC: 5.9 10*3/uL (ref 4.0–10.3)
lymph#: 2.3 10*3/uL (ref 0.9–3.3)

## 2016-01-10 LAB — COMPREHENSIVE METABOLIC PANEL
ALBUMIN: 4.1 g/dL (ref 3.5–5.0)
ALK PHOS: 66 U/L (ref 40–150)
ALT: 13 U/L (ref 0–55)
ANION GAP: 8 meq/L (ref 3–11)
AST: 18 U/L (ref 5–34)
BILIRUBIN TOTAL: 0.48 mg/dL (ref 0.20–1.20)
BUN: 12.6 mg/dL (ref 7.0–26.0)
CO2: 27 mEq/L (ref 22–29)
CREATININE: 0.8 mg/dL (ref 0.7–1.3)
Calcium: 9.3 mg/dL (ref 8.4–10.4)
Chloride: 108 mEq/L (ref 98–109)
Glucose: 87 mg/dl (ref 70–140)
Potassium: 4 mEq/L (ref 3.5–5.1)
Sodium: 142 mEq/L (ref 136–145)
TOTAL PROTEIN: 7 g/dL (ref 6.4–8.3)

## 2016-01-10 NOTE — Telephone Encounter (Signed)
Gave pt cal & avs °

## 2016-01-13 NOTE — Progress Notes (Signed)
[name]    HEMATOLOGY/ONCOLOGY CLINIC NOTE  Date of Service: .01/10/2016    Patient Care Team: [name], MD as PCP - General (Family Medicine)  CHIEF COMPLAINTS/PURPOSE OF CONSULTATION:  Unprovoked b/l pulmonary embolism  HISTORY OF PRESENTING ILLNESS: plz see my initial consultation for details on initial presentation  INTERVAL HISTORY  Mr Mannan is here for his scheduled followup along with his parents .He notes no new chest pain or shortness of breath.  No other acute new symptoms. His lupus anticoagulant which was positive in March was read on with labs today and is currently pending. He has continued to be on Rivaroxaban without any bleeding issues. We discussed that he would remain at elevated risk of recurrent VTE since his first event was unprovoked and being a male also puts him at high risk.  We discussed patient's personal preference is. There are multiple questions were answered in details.   MEDICAL HISTORY:  Past Medical History  Diagnosis Date  . Medical history non-contributory   . PE (pulmonary embolism)     SURGICAL HISTORY: Past Surgical History  Procedure Laterality Date  . Orchiopexy  02/25/2012    Procedure: ORCHIOPEXY ADULT;  Surgeon: Marcine Matar, MD;  Location: [facility] OR;  Service: Urology;  Laterality: Bilateral;    SOCIAL HISTORY: Social History   Social History  . Marital Status: Single    Spouse Name: N/A  . Number of Children: N/A  . Years of Education: N/A   Occupational History  . Not on file.   Social History Main Topics  . Smoking status: Never Smoker   . Smokeless tobacco: Not on file  . Alcohol Use: No  . Drug Use: No  . Sexual Activity: Not on file   Other Topics Concern  . Not on file   Social History Narrative    FAMILY HISTORY: Family History  Problem Relation Age of Onset  . Hypertension Mother     ALLERGIES:  has No Known Allergies.  MEDICATIONS:  Current Outpatient Prescriptions  Medication Sig Dispense  Refill  . omeprazole (PRILOSEC) 20 MG capsule Take 1 capsule (20 mg total) by mouth daily. 20 capsule 0  . XARELTO 20 MG TABS tablet TAKE ONE TABLET (20 MG TOTAL) BY MOUTH DAILY.  4   No current facility-administered medications for this visit.    REVIEW OF SYSTEMS:    10 Point review of Systems was done is negative except as noted above.  PHYSICAL EXAMINATION: ECOG PERFORMANCE STATUS: 0 - Asymptomatic  . Filed Vitals:   01/10/16 1048  BP: 116/73  Pulse: 65  Temp: 98.4 F (36.9 C)  Resp: 16   Filed Weights   01/10/16 1048  Weight: 134 lb 12.8 oz (61.145 kg)   .Body mass index is 17.79 kg/(m^2).  GENERAL:alert, in no acute distress and comfortable SKIN: skin color, texture, turgor are normal, no rashes or significant lesions EYES: normal, conjunctiva are pink and non-injected, sclera clear OROPHARYNX:no exudate, no erythema and lips, buccal mucosa, and tongue normal  NECK: supple, no JVD, thyroid normal size, non-tender, without nodularity LYMPH:  no palpable lymphadenopathy in the cervical, axillary or inguinal LUNGS: clear to auscultation with normal respiratory effort HEART: regular rate & rhythm,  no murmurs and no lower extremity edema ABDOMEN: abdomen soft, non-tender, normoactive bowel sounds  Musculoskeletal: no cyanosis of digits and no clubbing  PSYCH: alert & oriented x 3 with fluent speech NEURO: no focal motor/sensory deficits  LABORATORY DATA:  I have reviewed the data as  listed  . CBC Latest Ref Rng 01/10/2016 09/07/2015 06/08/2015  WBC 4.0 - 10.3 10e3/uL 5.9 6.8 4.8  Hemoglobin 13.0 - 17.1 g/dL 78.215.3 95.615.5 21.315.0  Hematocrit 38.4 - 49.9 % 45.1 46.1 44.6  Platelets 140 - 400 10e3/uL 229 267 239    . CMP Latest Ref Rng 01/10/2016 09/07/2015 06/08/2015  Glucose 70 - 140 mg/dl 87 83 87  BUN 7.0 - 08.626.0 mg/dL 57.812.6 46.912.4 62.912.9  Creatinine 0.7 - 1.3 mg/dL 0.8 0.8 0.8  Sodium 528136 - 145 mEq/L 142 140 141  Potassium 3.5 - 5.1 mEq/L 4.0 4.0 3.9  Chloride 101 - 111  mmol/L - - -  CO2 22 - 29 mEq/L 27 28 28   Calcium 8.4 - 10.4 mg/dL 9.3 9.2 9.5  Total Protein 6.4 - 8.3 g/dL 7.0 7.2 6.8  Total Bilirubin 0.20 - 1.20 mg/dL 4.130.48 2.441.13 0.100.65  Alkaline Phos 40 - 150 U/L 66 61 68  AST 5 - 34 U/L 18 18 21   ALT 0 - 55 U/L 13 13 13    . Lab Results  Component Value Date   LDH 144 06/08/2015   D- dimer 09/07/2015: <0.20 Sed rate 2  Component     Latest Ref Rng 09/07/2015  PTT-LA     0.0 - 40.6 sec 45.7 (H)  PTT-LA Mix     0.0 - 40.6 sec 51.6 (H)  Hexagonal Phase Phospholipid     0 - 11 sec 7  DRVVT     0.0 - 44.0 sec 96.8 (H)  dRVVT Mix     0.0 - 44.0 sec 57.4 (H)  dRVVT Confirm     0.8 - 1.2 ratio 1.6 (H)  Lupus Anticoag Interp      Comment:  Anticardiolipin Ab,IgG,Qn     0 - 14 GPL U/mL <9  Anticardiolipin Ab,IgM,Qn     0 - 12 MPL U/mL <9  Anticardiolipin Ab,IgA,Qn     0 - 11 APL U/mL <9  Beta-2 Glycoprotein I Ab, IgG     0 - 20 GPI IgG units <9  Beta-2 Glyco 1 IgA     0 - 25 GPI IgA units <9  Beta-2 Glyco 1 IgM     0 - 32 GPI IgM units <9   Lupus anticoagulant panel      No reference range information available      Comments:           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           NOTE: Only persistent lupus anticoagulants are thought to be           of clinical           significance. For this reason, repeat testing in 12 or more            weeks after an           initial positive result should be considered to confirm or            refute the           presence of a lupus anticoagulant   ASSESSMENT & PLAN:    20 yo otherwise healthy African American Male with   1) Unprovoked bilateral pulmonary embolism. No previous personal or family history of VTE or arterial thrombosis. No obvious provoking factor. No clinical stigmata of autoimmune disease or malignancy. No significant chronic inflammatory or  infectious issues. No h/o vascular injury or central lines in the past. Extensive  hypercoagulable workup unrevealing when done on initial presentation.  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 The significance/persistence of this antibody needs to be confirmed with rpt testing in 12 weeks. Plan -patient has completed 9 months of therapeutic anticoagulation and is currently completely asymptomatic and feels well.  No issues with bleeding on anticoagulation.  -A repeat lupus anticoagulant antibody testing from today is currently pending. -We discussed that Irrespective of the lupus anticoagulant antibody and unprovoked significant VTE event in a male certainly warrants consideration for long-term anticoagulation due to elevated risk of repeat thrombosis which is usually higher than unprovoked event. -Persistent lupus anticoagulant antibody might allow for a more definable risk of recurrent thrombosis which might not only be venous but also arterial. However a negative lupus anticoagulant would not necessarily reduce or improve his risk profile. -I expressed clearly to the patient and his parents that most hematologists in this setting of unprovoked significant pulmonary embolisms and concern of High risk of recurrent events would recommend ongoing anticoagulation and that is what would be  My first preference. -If the patient/family chooses to get off anticoagulation I would recommend at least being on a full dose aspirin and repeating a d-dimer in 4 weeks off anticoagulation to determine if that is elevating into abnormal territory since this might be used as an early marker for elevated risk of thrombosis and reconsideration of therapeutic anticoagulation. -Alternatively he might choose to go on lower dose anticoagulation for risk reduction which would be a compromise approach.  Patient and his parents are having difficulties coming to a decision despite extensive counseling. Patient has also been afforded the option of getting a second opinion.  I  [facility] if he chooses to [facility] this.  -We will call him with the lupus anticoagulant antibody results.  Return to care with Dr. Candise CheKale in 5 weeks if patient chooses to change to Aspirin with rpt cbc, d-dimer.  if patient chooses to continue Xarelto or transition to reduced dose anti-coagulant could f/u with PCP [id number] to monitot renal and liver function and medication tolerability.   All of the patients and his family's questions were answered to their apparent satisfaction. The patient knows to call the clinic with any problems, questions or concerns.   I spent 25 minutes counseling the patient face to face. The total time spent in the appointment was 25 minutes and more than 50% was on counseling and direct patient cares.    Wyvonnia LoraGautam Issam Carlyon MD MS AAHIVMS [facility] Hematology/Oncology Physician [facility]  (Office):       [phone] (Work cell):  [phone] (Fax):           [phone]

## 2016-01-14 LAB — LUPUS ANTICOAGULANT PANEL
DRVVT: 62.9 s — AB (ref 0.0–47.0)
PTT-LA: 40.8 s (ref 0.0–43.6)
dRVVT Confirm: 1.1 ratio (ref 0.8–1.2)
dRVVT Mix: 52.6 s — ABNORMAL HIGH (ref 0.0–47.0)

## 2016-02-14 ENCOUNTER — Other Ambulatory Visit: Payer: BLUE CROSS/BLUE SHIELD

## 2016-02-14 ENCOUNTER — Ambulatory Visit: Payer: BLUE CROSS/BLUE SHIELD | Admitting: Hematology

## 2016-06-11 ENCOUNTER — Telehealth: Payer: Self-pay | Admitting: *Deleted

## 2016-06-11 NOTE — Telephone Encounter (Signed)
"  Have not received a return call yet.  I'm calling for my son Frank HuaDavid who had lab work in June.  He missed appointment in July.  He needs the results explained to him to understand what's going on.  If Dr. Candise CheKale could call us at [phone].  The PCP can see the results but suggest Dr. Candise CheKale explain the results.  Not sure if he needs an appointment or if the doctor will call.  He's a Archivistcollege student, lives at home."

## 2016-08-15 ENCOUNTER — Ambulatory Visit (HOSPITAL_COMMUNITY)
Admission: EM | Admit: 2016-08-15 | Discharge: 2016-08-15 | Disposition: A | Discharge status: Home / Self Care | Payer: BLUE CROSS/BLUE SHIELD | Attending: Family Medicine | Admitting: Family Medicine

## 2016-08-15 ENCOUNTER — Encounter (HOSPITAL_COMMUNITY): Payer: Self-pay | Admitting: Emergency Medicine

## 2016-08-15 DIAGNOSIS — R112 Nausea with vomiting, unspecified: Secondary | ICD-10-CM | POA: Diagnosis not present

## 2016-08-15 DIAGNOSIS — B349 Viral infection, unspecified: Secondary | ICD-10-CM

## 2016-08-15 MED ORDER — ONDANSETRON HCL 4 MG PO TABS: 4.0000 mg | ORAL_TABLET | Freq: Four times a day (QID) | ORAL | 0 refills | Status: DC

## 2016-08-15 NOTE — ED Provider Notes (Signed)
CSN: [id number]     Arrival date & time 08/15/16  1951 History   First MD Initiated Contact with Patient 08/15/16 2036     Chief Complaint  Patient presents with  . Fever  . Emesis  . Cough   (Consider location/radiation/quality/duration/timing/severity/associated sxs/prior Treatment) 21 year old male complaining of headache, weakness, fever and 2 episodes of vomiting that started this morning. After vomiting his throat started feeling sore. His motor home was 101. Denies earache. Does have a cough occasionally. No shortness of breath. Currently Xarelto due to a PE approximate 1 year ago.      Past Medical History:  Diagnosis Date  . Medical history non-contributory   . PE (pulmonary embolism)    Past Surgical History:  Procedure Laterality Date  . ORCHIOPEXY  02/25/2012   Procedure: ORCHIOPEXY ADULT;  Surgeon: Marcine Matar, MD;  Location: [facility] OR;  Service: Urology;  Laterality: Bilateral;   Family History  Problem Relation Age of Onset  . Hypertension Mother    Social History  Substance Use Topics  . Smoking status: Never Smoker  . Smokeless tobacco: Never Used  . Alcohol use No    Review of Systems  Constitutional: Positive for activity change, appetite change, fatigue and fever.  HENT: Positive for postnasal drip and rhinorrhea.   Eyes: Negative.   Respiratory: Positive for cough. Negative for shortness of breath.   Cardiovascular: Negative for chest pain, palpitations and leg swelling.  Gastrointestinal: Positive for abdominal pain and vomiting.  Musculoskeletal: Negative.   Neurological: Negative.   All other systems reviewed and are negative.   Allergies  Patient has no known allergies.  Home Medications   Prior to Admission medications   Medication Sig Start Date End Date Taking? Authorizing Provider  XARELTO 20 MG TABS tablet TAKE ONE TABLET (20 MG TOTAL) BY MOUTH DAILY. 05/19/15  Yes Historical Provider, MD  omeprazole (PRILOSEC) 20 MG capsule  Take 1 capsule (20 mg total) by mouth daily. 09/04/15   Nelva Nay, MD   Meds Ordered and Administered this Visit  Medications - No data to display  BP 105/64 (BP Location: Left Arm)   Pulse (!) 123   Temp 100.7 F (38.2 C) (Oral)   SpO2 99%  No data found.   Physical Exam  Constitutional: He is oriented to person, place, and time. He appears well-developed and well-nourished. No distress.  HENT:  Head: Normocephalic and atraumatic.  Right Ear: External ear normal.  Left Ear: External ear normal.  Mouth/Throat: No oropharyngeal exudate.  Bilateral TMs are normal. Oropharynx with minor injection, minor cobblestoning and clear PND.  Eyes: EOM are normal. Pupils are equal, round, and reactive to light.  Neck: Normal range of motion. Neck supple.  Cardiovascular: Normal rate, regular rhythm, normal heart sounds and intact distal pulses.   Pulmonary/Chest: Effort normal and breath sounds normal. No respiratory distress. He has no wheezes. He has no rales.  Abdominal: Soft. He exhibits no distension and no mass. There is no tenderness. There is no rebound and no guarding.  Musculoskeletal: Normal range of motion. He exhibits no edema.  Lymphadenopathy:    He has no cervical adenopathy.  Neurological: He is alert and oriented to person, place, and time.  Skin: Skin is warm and dry. No rash noted.  Psychiatric: He has a normal mood and affect.  Nursing note and vitals reviewed.   Urgent Care Course     Procedures (including critical care time)  Labs Review Labs Reviewed - No data to display  Imaging Review No results found.   Visual Acuity Review  Right Eye Distance:   Left Eye Distance:   Bilateral Distance:    Right Eye Near:   Left Eye Near:    Bilateral Near:         MDM   1. Viral syndrome   2. Nausea and vomiting, intractability of vomiting not specified, unspecified vomiting type    Drink primarily clear liquids in small frequent amounts over the  next several hours. Given a prescription for Zofran which can help with nausea and vomiting. Avoid dairy products. Tomorrow evening as long as you are having no nausea or vomiting and drinking well slowly a denture diet with food such as crackers, toast and potatoes. Tylenol every 4 hours for fever. He may use Zyrtec or Allegra for drainage. Delsym for cough if needed. For worsening, new symptoms or problems may return. Meds ordered this encounter  Medications  . ondansetron (ZOFRAN) 4 MG tablet    Sig: Take 1 tablet (4 mg total) by mouth every 6 (six) hours.    Dispense:  12 tablet    Refill:  0    Order Specific Question:   Supervising Provider    Answer:   Linna HoffKINDL, JAMES D [[id number]]       Hayden Rasmussenavid Djuana Littleton, NP 08/15/16 2059    Hayden Rasmussenavid Nayda Riesen, NP 08/15/16 [id number]    Hayden Rasmussenavid Roshard Rezabek, NP 08/15/16 2100

## 2016-08-15 NOTE — Discharge Instructions (Signed)
Drink primarily clear liquids in small frequent amounts over the next several hours. Given a prescription for Zofran which can help with nausea and vomiting. Avoid dairy products. Tomorrow evening as long as you are having no nausea or vomiting and drinking well slowly a denture diet with food such as crackers, toast and potatoes. Tylenol every 4 hours for fever. He may use Zyrtec or Allegra for drainage. Delsym for cough if needed. For worsening, new symptoms or problems may return.

## 2016-08-15 NOTE — ED Triage Notes (Signed)
Pt describes a "heavy head", cough and 2 episodes of vomiting.  Pt states the coughing has caused his throat to be sore.  Pt reports a fever of 101 at home before coming here.

## 2016-08-28 ENCOUNTER — Encounter: Payer: Self-pay | Admitting: Hematology

## 2016-08-28 ENCOUNTER — Ambulatory Visit (HOSPITAL_BASED_OUTPATIENT_CLINIC_OR_DEPARTMENT_OTHER): Payer: BLUE CROSS/BLUE SHIELD | Admitting: Hematology

## 2016-08-28 VITALS — BP 105/64 | HR 75 | Temp 97.5°F | Resp 18 | Ht 73.0 in | Wt 131.4 lb

## 2016-08-28 DIAGNOSIS — D6859 Other primary thrombophilia: Secondary | ICD-10-CM

## 2016-08-28 DIAGNOSIS — I2699 Other pulmonary embolism without acute cor pulmonale: Secondary | ICD-10-CM | POA: Diagnosis not present

## 2016-08-28 DIAGNOSIS — D6862 Lupus anticoagulant syndrome: Secondary | ICD-10-CM

## 2016-08-28 DIAGNOSIS — I2782 Chronic pulmonary embolism: Secondary | ICD-10-CM

## 2016-08-31 NOTE — Progress Notes (Signed)
[name].    HEMATOLOGY/ONCOLOGY CLINIC NOTE  Date of Service: .08/28/2016    Patient Care Team: Willow Oraamille L Andy, MD as PCP - General (Family Medicine)  CHIEF COMPLAINTS/PURPOSE OF CONSULTATION:  Unprovoked b/l pulmonary embolism  HISTORY OF PRESENTING ILLNESS: plz see my initial consultation for details on initial presentation  INTERVAL HISTORY  Frank Frederick is here for his scheduled followup along with his parents .He notes no new chest pain or shortness of breath.  No other acute new symptoms. His lupus anticoagulant which was positive in feb 2017 but was not detected on repeat labs done in June 2017 off the Xarelto. Patient has continued to be on Xarelto. He is here for a repeat discussion of treatment options going ahead. No issues with bleeding on Xarelto. Patient is keen to get off the Xarelto and his parents are agreeable with this . We discussed that we would recommend at the very least him being on a baby Aspirin daily and staying physically active.    MEDICAL HISTORY:  Past Medical History:  Diagnosis Date  . Medical history non-contributory   . PE (pulmonary embolism)     SURGICAL HISTORY: Past Surgical History:  Procedure Laterality Date  . ORCHIOPEXY  02/25/2012   Procedure: ORCHIOPEXY ADULT;  Surgeon: Marcine MatarStephen Dahlstedt, MD;  [facility];  Service: Urology;  Laterality: Bilateral;    SOCIAL HISTORY: Social History   Social History  . Marital status: Single    Spouse name: N/A  . Number of children: N/A  . Years of education: N/A   Occupational History  . Not on file.   Social History Main Topics  . Smoking status: Never Smoker  . Smokeless tobacco: Never Used  . Alcohol use No  . Drug use: No  . Sexual activity: Not on file   Other Topics Concern  . Not on file   Social History Narrative  . No narrative on file    FAMILY HISTORY: Family History  Problem Relation Age of Onset  . Hypertension Mother     ALLERGIES:  has No Known  Allergies.  MEDICATIONS:  Current Outpatient Prescriptions  Medication Sig Dispense Refill  . XARELTO 20 MG TABS tablet TAKE ONE TABLET (20 MG TOTAL) BY MOUTH DAILY.  4   No current facility-administered medications for this visit.     REVIEW OF SYSTEMS:    10 Point review of Systems was done is negative except as noted above.  PHYSICAL EXAMINATION: ECOG PERFORMANCE STATUS: 0 - Asymptomatic  . Vitals:   08/28/16 1520  BP: 105/64  Pulse: 75  Resp: 18  Temp: 97.5 F (36.4 C)   Filed Weights   08/28/16 1520  Weight: 131 lb 6.4 oz (59.6 kg)   .Body mass index is 17.34 kg/m.  GENERAL:alert, in no acute distress and comfortable SKIN: skin color, texture, turgor are normal, no rashes or significant lesions EYES: normal, conjunctiva are pink and non-injected, sclera clear OROPHARYNX:no exudate, no erythema and lips, buccal mucosa, and tongue normal  NECK: supple, no JVD, thyroid normal size, non-tender, without nodularity LYMPH:  no palpable lymphadenopathy in the cervical, axillary or inguinal LUNGS: clear to auscultation with normal respiratory effort HEART: regular rate & rhythm,  no murmurs and no lower extremity edema ABDOMEN: abdomen soft, non-tender, normoactive bowel sounds  Musculoskeletal: no cyanosis of digits and no clubbing  PSYCH: alert & oriented x 3 with fluent speech NEURO: no focal motor/sensory deficits  LABORATORY DATA:  I have reviewed the data as listed  .  CBC Latest Ref Rng & Units 01/10/2016 09/07/2015 06/08/2015  WBC 4.0 - 10.3 10e3/uL 5.9 6.8 4.8  Hemoglobin 13.0 - 17.1 g/dL 96.0 45.4 09.8  Hematocrit 38.4 - 49.9 % 45.1 46.1 44.6  Platelets 140 - 400 10e3/uL 229 267 239    . CMP Latest Ref Rng & Units 01/10/2016 09/07/2015 06/08/2015  Glucose 70 - 140 mg/dl 87 83 87  BUN 7.0 - 11.9 mg/dL 14.7 82.9 56.2  Creatinine 0.7 - 1.3 mg/dL 0.8 0.8 0.8  Sodium 130 - 145 mEq/L 142 140 141  Potassium 3.5 - 5.1 mEq/L 4.0 4.0 3.9  Chloride 101 - 111  mmol/L - - -  CO2 22 - 29 mEq/L 27 28 28   Calcium 8.4 - 10.4 mg/dL 9.3 9.2 9.5  Total Protein 6.4 - 8.3 g/dL 7.0 7.2 6.8  Total Bilirubin 0.20 - 1.20 mg/dL 8.65 7.84 6.96  Alkaline Phos 40 - 150 U/L 66 61 68  AST 5 - 34 U/L 18 18 21   ALT 0 - 55 U/L 13 13 13    . Lab Results  Component Value Date   LDH 144 06/08/2015   D- dimer 09/07/2015: <0.20 Sed rate 2    ASSESSMENT & PLAN:    21 yo otherwise healthy African American Male with   1) Unprovoked bilateral pulmonary embolism. No previous personal or family history of VTE or arterial thrombosis. No obvious provoking factor. No clinical stigmata of autoimmune disease or malignancy. No significant chronic inflammatory or infectious issues. No h/o vascular injury or central lines in the past. Extensive hypercoagulable workup unrevealing when done on initial presentation.  Rpt f/u labs from 08/2015 show the presence of lupus anticoagulant antibody vs false positive due to Xarelto.   Rpt lupus anticoagulant testing off Xarelto was neg in 12/2015. Plan -patient has completed nearly 15 months of therapeutic anticoagulation and is currently completely asymptomatic and feels well.  No issues with bleeding on anticoagulation.  -Patient is keen to get off the Xarelto. We discussed that Irrespective of the lupus anticoagulant antibody an unprovoked significant VTE event in a male certainly warrants consideration for long-term anticoagulation due to elevated risk of repeat thrombosis which is usually higher than unprovoked event. -after discussing this the patient and his parents choose to switch to daily Aspirin considering the burden and risks of long term anticoagulation. -Patient will take daily baby aspirin. -He was recommended to pursue appropriate anti-VTE lifestyle modifications as recommended.  -He was recommended to consider using Xarelto around the time of predicted high-risk event such as long-distance travel. Take it one day before  and for 1-2 days after long-distance travel or during periods of forced immobility. -He is aware of concerning signs and symptoms to monitor.   All of the patients and his family's questions were answered to their apparent satisfaction. The patient knows to call the clinic with any problems, questions or concerns.   I spent 20 minutes counseling the patient face to face. The total time spent in the appointment was 20 minutes and more than 50% was on counseling and direct patient cares.    Wyvonnia Lora MD MS AAHIVMS [facility] Hematology/Oncology Physician [facility]  (Office):       [phone] (Work cell):  [phone] (Fax):           [phone]

## 2016-11-02 IMAGING — CR DG CHEST 2V
2 series · 2 of 2 positions shown · non-contrast
Comparison: None.

CLINICAL DATA: RIGHT upper back pain, epigastric and mid chest
discomfort.

EXAM:
CHEST  2 VIEW

[w chest pa]
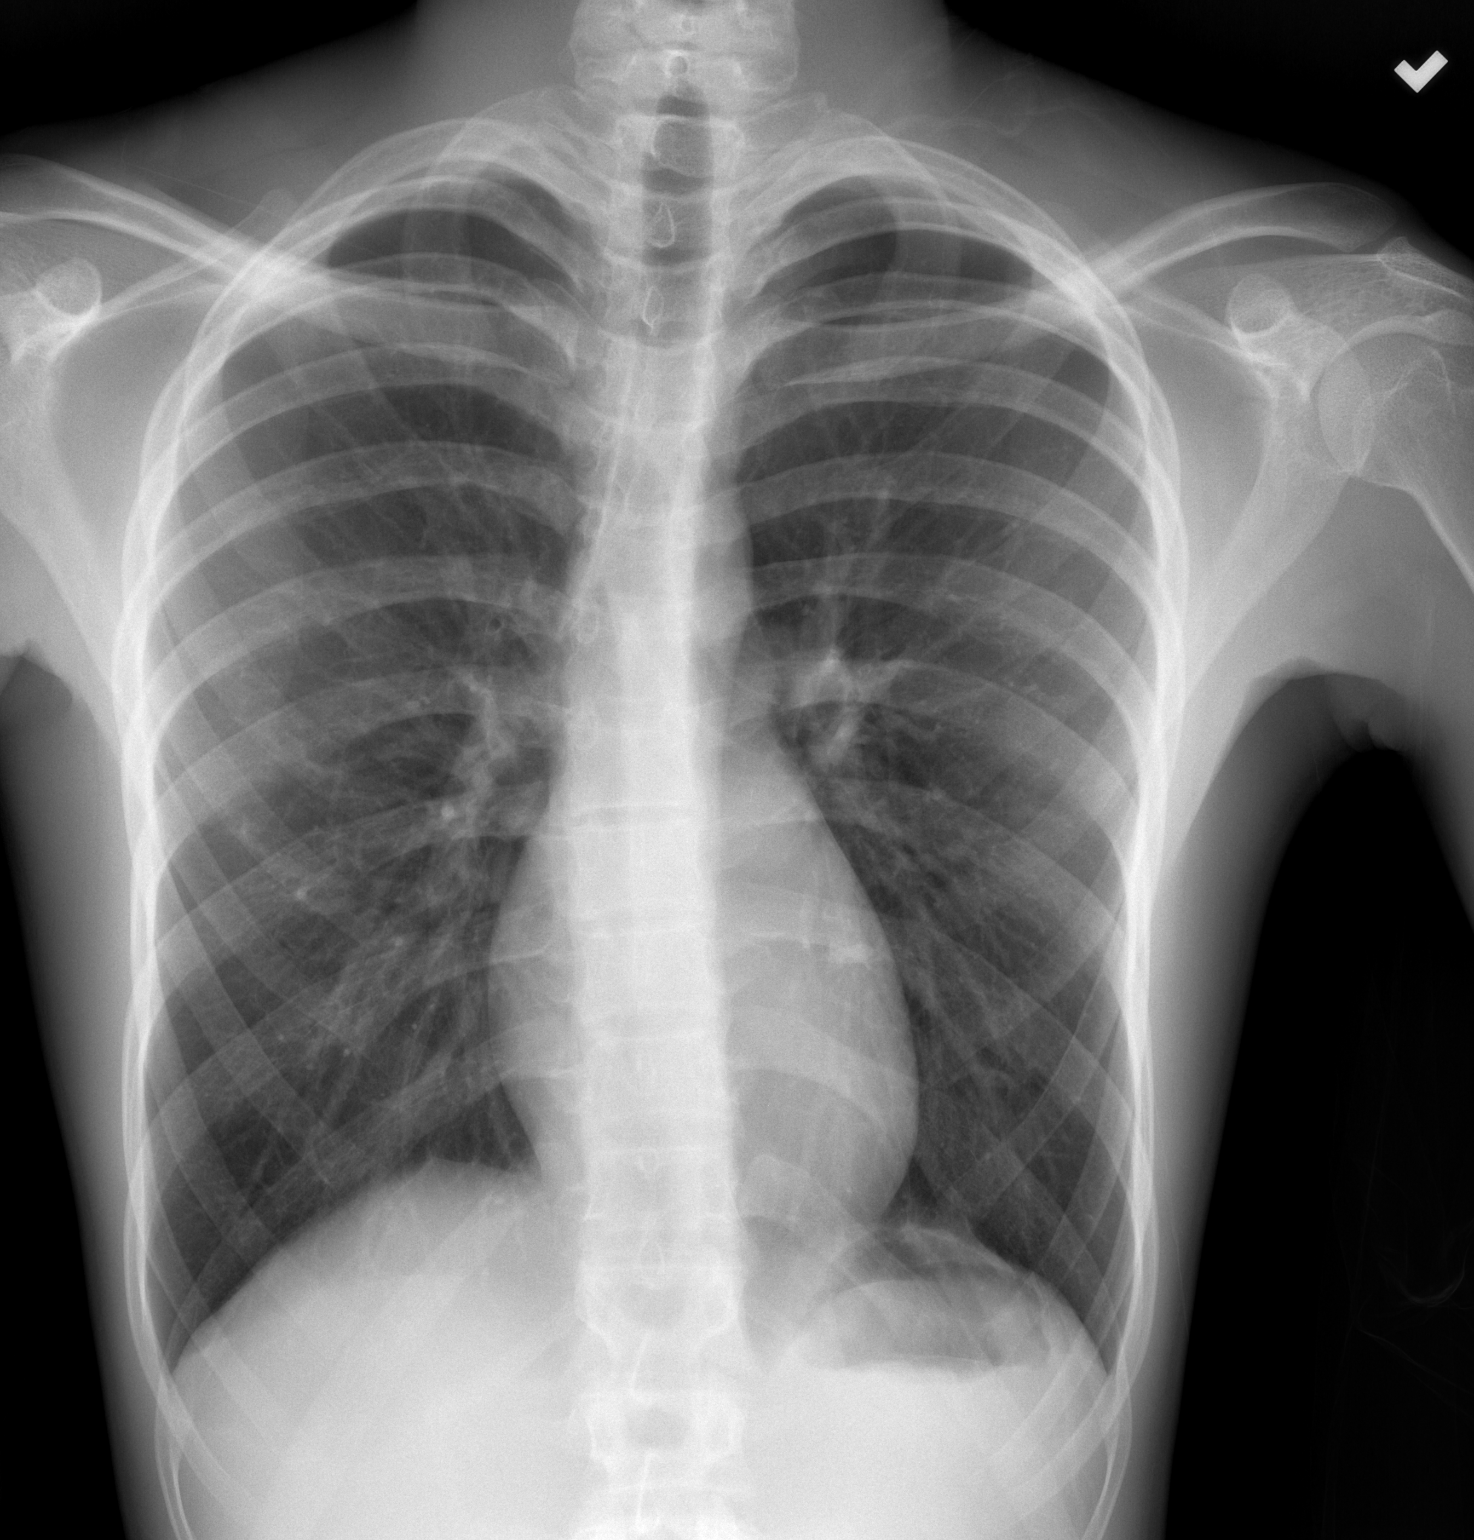

[w chest lat]
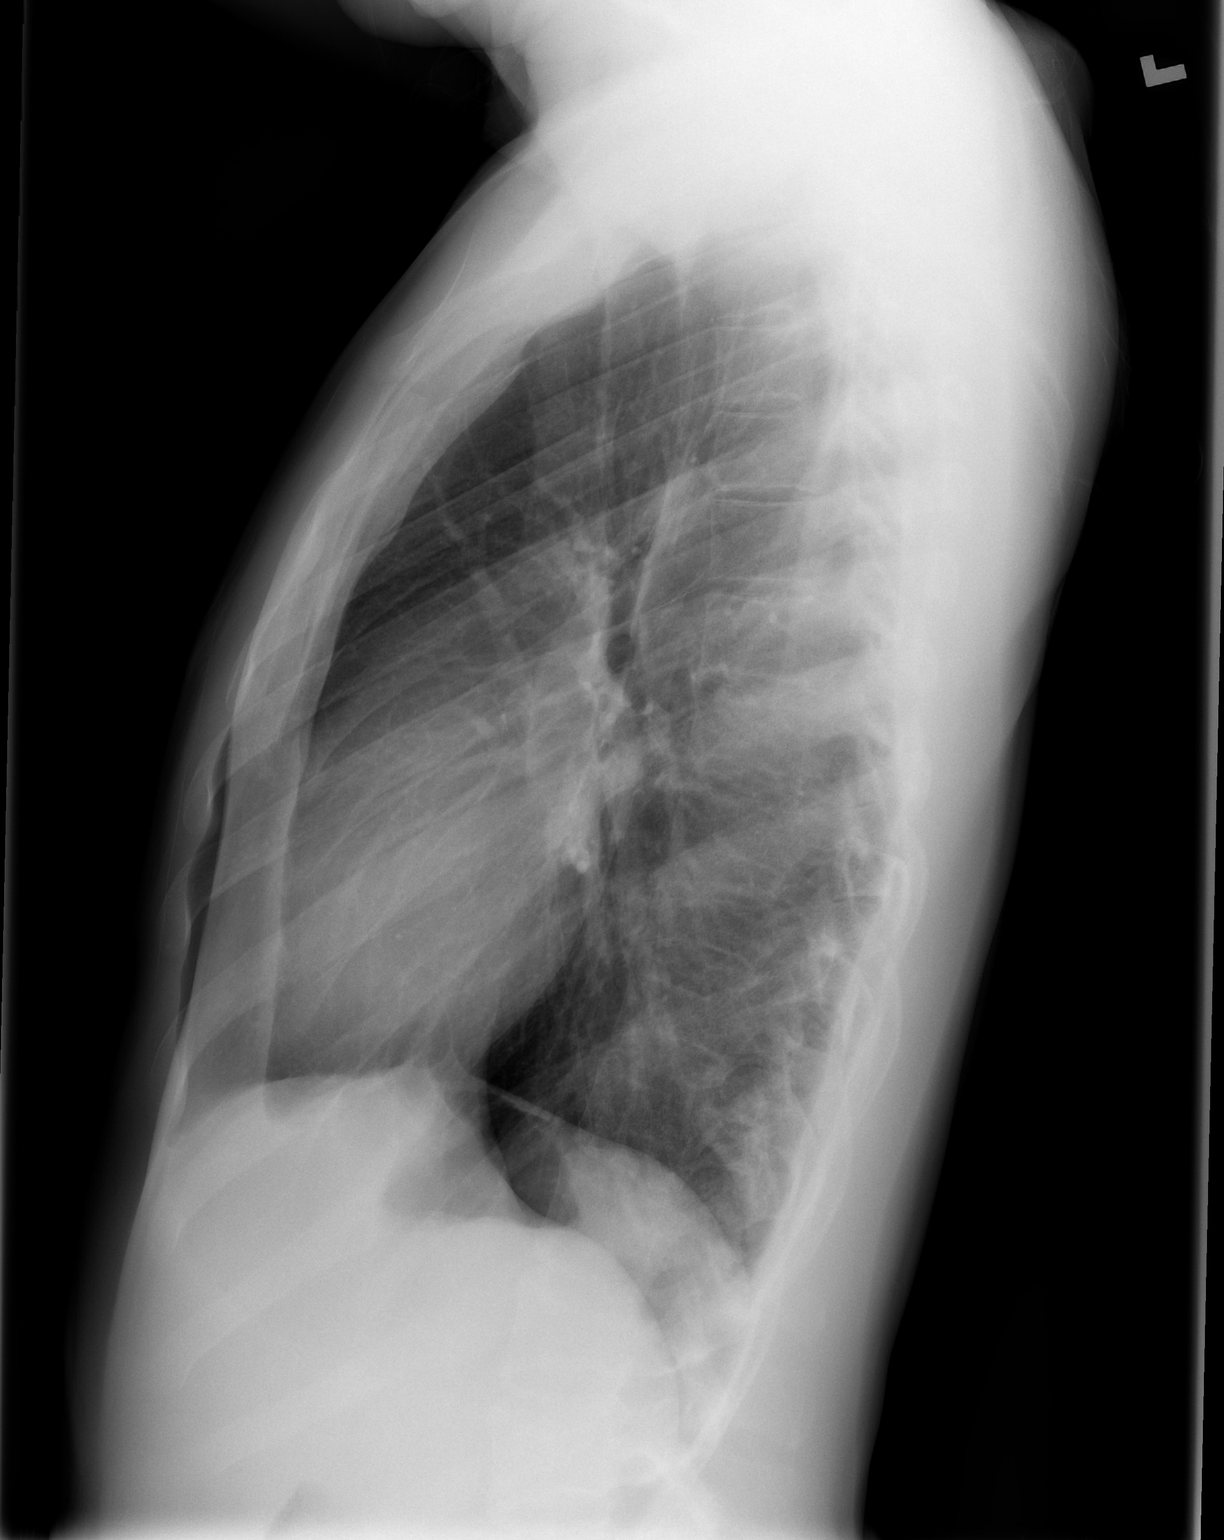

[2 of 2 positions shown; findings below may reference images not displayed]

FINDINGS: Cardiomediastinal silhouette is normal. The lungs are clear without
pleural effusions or focal consolidations. Trachea projects midline
and there is no pneumothorax. Soft tissue planes and included
osseous structures are non-suspicious.
IMPRESSION: Normal chest.

## 2018-07-24 ENCOUNTER — Ambulatory Visit
Admission: EM | Admit: 2018-07-24 | Discharge: 2018-07-24 | Disposition: A | Discharge status: Home / Self Care | Payer: Commercial Managed Care - PPO

## 2018-07-24 ENCOUNTER — Other Ambulatory Visit: Payer: Self-pay

## 2018-07-24 ENCOUNTER — Encounter: Payer: Self-pay | Admitting: Emergency Medicine

## 2018-07-24 DIAGNOSIS — R059 Cough, unspecified: Secondary | ICD-10-CM

## 2018-07-24 DIAGNOSIS — R05 Cough: Secondary | ICD-10-CM | POA: Insufficient documentation

## 2018-07-24 MED ORDER — BENZONATATE 200 MG PO CAPS: 200.0000 mg | ORAL_CAPSULE | Freq: Two times a day (BID) | ORAL | 0 refills | Status: DC | PRN

## 2018-07-24 NOTE — ED Provider Notes (Signed)
[facility]    CSN: [id number] Arrival date & time: 07/24/18  1230     History   Chief Complaint Chief Complaint  Patient presents with  . Cough  . Nasal Congestion    HPI Frank Frederick is a 23 y.o. male.   HPI  Patient is here for cold symptoms.  Has had a cough for 4 days.  Has had some brown sputum in the morning.  He is mostly worried about coughing at night that is keeping him awake.  No fever or chills.  No head congestion or sinus drainage.  No body aches or influenza symptoms.  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  Past Medical History:  Diagnosis Date  . Medical history non-contributory   . PE (pulmonary embolism)     Patient Active Problem List   Diagnosis Date Noted  . Primary hypercoagulable state (HCC) 09/07/2015  . Pulmonary emboli (HCC) 04/20/2015  . Pulmonary embolism (HCC) 04/20/2015  . Body mass index (BMI) of 19 or less in adult 02/17/2013  . Torsion, testicular 02/17/2013    Past Surgical History:  Procedure Laterality Date  . ORCHIOPEXY  02/25/2012   Procedure: ORCHIOPEXY ADULT;  Surgeon: Marcine Matar, MD;  Location: [facility] OR;  Service: Urology;  Laterality: Bilateral;       Home Medications    Prior to Admission medications   Medication Sig Start Date End Date Taking? Authorizing Provider  aspirin EC 81 MG tablet Take 81 mg by mouth daily.   Yes [provider]  benzonatate (TESSALON) 200 MG capsule Take 1 capsule (200 mg total) by mouth 2 (two) times daily as needed for cough. 07/24/18   Eustace Moore, MD    Family History Family History  Problem Relation Age of Onset  . Hypertension Mother     Social History Social History   Tobacco Use  . Smoking status: Never Smoker  . Smokeless tobacco: Never Used  Substance Use Topics  . Alcohol use: No  . Drug use: No     Allergies   Patient has no known allergies.   Review of Systems Review of Systems  Constitutional: Negative for chills  and fever.  HENT: Negative for ear pain and sore throat.   Eyes: Negative for pain and visual disturbance.  Respiratory: Positive for cough. Negative for shortness of breath.   Cardiovascular: Negative for chest pain and palpitations.  Gastrointestinal: Negative for abdominal pain and vomiting.  Genitourinary: Negative for dysuria and hematuria.  Musculoskeletal: Negative for arthralgias and back pain.  Skin: Negative for color change and rash.  Neurological: Negative for seizures and syncope.  Psychiatric/Behavioral: Positive for sleep disturbance.  All other systems reviewed and are negative.    Physical Exam Triage Vital Signs ED Triage Vitals  Enc Vitals Group     BP 07/24/18 1327 107/67     Pulse Rate 07/24/18 1327 94     Resp --      Temp 07/24/18 1327 (!) 97.4 F (36.3 C)     Temp Source 07/24/18 1327 Oral     SpO2 07/24/18 1327 96 %     Weight 07/24/18 1328 137 lb (62.1 kg)     Height 07/24/18 1328 6' (1.829 m)   No data found.  Updated Vital Signs BP 107/67 (BP Location: Right Arm)   Pulse 94   Temp (!) 97.4 F (36.3 C) (Oral)   Ht 6' (1.829 m)   Wt 62.1 kg   SpO2 96%  BMI 18.58 kg/m       Physical Exam Constitutional:      General: He is not in acute distress.    Appearance: He is well-developed.  HENT:     Head: Normocephalic and atraumatic.     Right Ear: Tympanic membrane and ear canal normal.     Left Ear: Tympanic membrane and ear canal normal.  Eyes:     Conjunctiva/sclera: Conjunctivae normal.     Pupils: Pupils are equal, round, and reactive to light.  Neck:     Musculoskeletal: Normal range of motion.  Cardiovascular:     Rate and Rhythm: Normal rate and regular rhythm.     Heart sounds: Normal heart sounds.  Pulmonary:     Effort: Pulmonary effort is normal. No respiratory distress.     Breath sounds: Normal breath sounds.     Comments: Lungs are clear Abdominal:     General: There is no distension.     Palpations: Abdomen is  soft.  Musculoskeletal: Normal range of motion.  Skin:    General: Skin is warm and dry.  Neurological:     General: No focal deficit present.     Mental Status: He is alert. Mental status is at baseline.      UC Treatments / Results  Labs (all labs ordered are listed, but only abnormal results are displayed) Labs Reviewed - No data to display  EKG None  Radiology No results found.  Procedures Procedures (including critical care time)  Medications Ordered in UC Medications - No data to display  Initial Impression / Assessment and Plan / UC Course  I have reviewed the triage vital signs and the nursing notes.  Pertinent labs & imaging results that were available during my care of the patient were reviewed by me and considered in my medical decision making (see chart for details).     Discussed this is a viral illness.  Symptomatic care offered.  Antibiotics will not be of benefit.  Patient acknowledges Final Clinical Impressions(s) / UC Diagnoses   Final diagnoses:  Cough     Discharge Instructions     Take the tessalon 2 x a day Take a DM two times a day This is in delsym or mucinex   Push fluids    ED Prescriptions    Medication Sig Dispense Auth. Provider   benzonatate (TESSALON) 200 MG capsule Take 1 capsule (200 mg total) by mouth 2 (two) times daily as needed for cough. 20 capsule Eustace Moore, MD     Controlled Substance Prescriptions  Controlled Substance Registry consulted? Not Applicable   Eustace Moore, MD 07/24/18 [phone]

## 2018-07-24 NOTE — Discharge Instructions (Signed)
Take the tessalon 2 x a day Take a DM two times a day This is in delsym or mucinex   Push fluids

## 2018-07-24 NOTE — ED Triage Notes (Signed)
Per pt he has been having a cold for about a week then has turned into cough and congestion his his chest around Thursday. Pt has no fevers nor chills.

## 2020-04-10 ENCOUNTER — Emergency Department (HOSPITAL_COMMUNITY): Payer: Commercial Managed Care - PPO

## 2020-04-10 DIAGNOSIS — S29011A Strain of muscle and tendon of front wall of thorax, initial encounter: Secondary | ICD-10-CM | POA: Insufficient documentation

## 2020-04-10 DIAGNOSIS — Y9289 Other specified places as the place of occurrence of the external cause: Secondary | ICD-10-CM | POA: Insufficient documentation

## 2020-04-10 DIAGNOSIS — Z86711 Personal history of pulmonary embolism: Secondary | ICD-10-CM | POA: Diagnosis not present

## 2020-04-10 DIAGNOSIS — X503XXA Overexertion from repetitive movements, initial encounter: Secondary | ICD-10-CM | POA: Insufficient documentation

## 2020-04-10 DIAGNOSIS — Z7982 Long term (current) use of aspirin: Secondary | ICD-10-CM | POA: Insufficient documentation

## 2020-04-10 DIAGNOSIS — R0789 Other chest pain: Secondary | ICD-10-CM | POA: Diagnosis present

## 2020-04-10 DIAGNOSIS — Y93B2 Activity, push-ups, pull-ups, sit-ups: Secondary | ICD-10-CM | POA: Insufficient documentation

## 2020-04-11 ENCOUNTER — Emergency Department (HOSPITAL_COMMUNITY)
Admission: EM | Admit: 2020-04-11 | Discharge: 2020-04-11 | Disposition: A | Discharge status: Home / Self Care | Payer: Commercial Managed Care - PPO | Attending: Emergency Medicine | Admitting: Emergency Medicine

## 2020-04-11 ENCOUNTER — Other Ambulatory Visit: Payer: Self-pay

## 2020-04-11 ENCOUNTER — Encounter (HOSPITAL_COMMUNITY): Payer: Self-pay | Admitting: Emergency Medicine

## 2020-04-11 DIAGNOSIS — S29011A Strain of muscle and tendon of front wall of thorax, initial encounter: Secondary | ICD-10-CM

## 2020-04-11 LAB — CBC
HCT: 45.8 % (ref 39.0–52.0)
Hemoglobin: 15.1 g/dL (ref 13.0–17.0)
MCH: 28.4 pg (ref 26.0–34.0)
MCHC: 33 g/dL (ref 30.0–36.0)
MCV: 86.1 fL (ref 80.0–100.0)
Platelets: 311 10*3/uL (ref 150–400)
RBC: 5.32 MIL/uL (ref 4.22–5.81)
RDW: 13 % (ref 11.5–15.5)
WBC: 8.5 10*3/uL (ref 4.0–10.5)
nRBC: 0 % (ref 0.0–0.2)

## 2020-04-11 LAB — BASIC METABOLIC PANEL
Anion gap: 9 (ref 5–15)
BUN: 15 mg/dL (ref 6–20)
CO2: 30 mmol/L (ref 22–32)
Calcium: 9.5 mg/dL (ref 8.9–10.3)
Chloride: 102 mmol/L (ref 98–111)
Creatinine, Ser: 0.71 mg/dL (ref 0.61–1.24)
GFR calc Af Amer: >60 mL/min (ref >60)
GFR calc non Af Amer: >60 mL/min (ref >60)
Glucose, Bld: 96 mg/dL (ref 70–99)
Potassium: 4 mmol/L (ref 3.5–5.1)
Sodium: 141 mmol/L (ref 135–145)

## 2020-04-11 LAB — TROPONIN I (HIGH SENSITIVITY)
Troponin I (High Sensitivity): 2 ng/L (ref <18)
Troponin I (High Sensitivity): 3 ng/L (ref <18)

## 2020-04-11 LAB — D-DIMER, QUANTITATIVE: D-Dimer, Quant: <0.27 ug/mL-FEU (ref 0.00–0.50)

## 2020-04-11 NOTE — Discharge Instructions (Addendum)
You may use over-the-counter Motrin (Ibuprofen), Acetaminophen (Tylenol), topical muscle creams such as SalonPas, Icy Hot, Bengay, etc. Please stretch, apply heat, and have massage therapy for additional assistance. ° °

## 2020-04-11 NOTE — ED Provider Notes (Signed)
[facility]-EMERGENCY DEPT Provider Note  CSN: [id number] Arrival date & time: 04/10/20 2303  Chief Complaint(s) Chest Pain  HPI Frank Frederick is a 24 y.o. male    The history is provided by the patient.  Chest Pain Pain location:  L chest and L lateral chest (pectoral) Pain quality comment:  Sore Pain radiates to:  Does not radiate Pain severity:  Moderate Onset quality:  Gradual Duration:  10 hours Timing:  Intermittent Progression:  Waxing and waning Chronicity:  New Context: movement   Relieved by:  Nothing Worsened by:  Movement Associated symptoms: no anxiety, no claudication, no cough, no heartburn, no nausea, no palpitations and no shortness of breath   Risk factors: prior DVT/PE    Feels different from his prior PE, but given his history he wanted to come in to get checked out.  Patient reports that he was doing push-ups earlier in the day and working in the lawn.  Past Medical History Past Medical History:  Diagnosis Date  . Medical history non-contributory   . PE (pulmonary embolism)    Patient Active Problem List   Diagnosis Date Noted  . Primary hypercoagulable state (HCC) 09/07/2015  . Pulmonary emboli (HCC) 04/20/2015  . Pulmonary embolism (HCC) 04/20/2015  . Body mass index (BMI) of 19 or less in adult 02/17/2013  . Torsion, testicular 02/17/2013   Home Medication(s) Prior to Admission medications   Medication Sig Start Date End Date Taking? Authorizing Provider  aspirin EC 81 MG tablet Take 81 mg by mouth daily.    [provider]  benzonatate (TESSALON) 200 MG capsule Take 1 capsule (200 mg total) by mouth 2 (two) times daily as needed for cough. 07/24/18   Eustace Moore, MD                                                                                                                                    Past Surgical History Past Surgical History:  Procedure Laterality Date  . ORCHIOPEXY  02/25/2012    Procedure: ORCHIOPEXY ADULT;  Surgeon: Marcine Matar, MD;  Location: [facility] OR;  Service: Urology;  Laterality: Bilateral;   Family History Family History  Problem Relation Age of Onset  . Hypertension Mother     Social History Social History   Tobacco Use  . Smoking status: Never Smoker  . Smokeless tobacco: Never Used  Substance Use Topics  . Alcohol use: No  . Drug use: No   Allergies Patient has no known allergies.  Review of Systems Review of Systems  Respiratory: Negative for cough and shortness of breath.   Cardiovascular: Positive for chest pain. Negative for palpitations and claudication.  Gastrointestinal: Negative for heartburn and nausea.   All other systems are reviewed and are negative for acute change except as noted in the HPI  Physical Exam Vital Signs  I have reviewed the triage vital signs BP 135/70  Pulse (!) 56   Temp 98 F (36.7 C) (Oral)   Resp 15   Ht 6' (1.829 m)   Wt 64.9 kg   SpO2 98%   BMI 19.39 kg/m   Physical Exam Vitals reviewed.  Constitutional:      General: He is not in acute distress.    Appearance: He is well-developed. He is not diaphoretic.  HENT:     Head: Normocephalic and atraumatic.     Nose: Nose normal.  Eyes:     General: No scleral icterus.       Right eye: No discharge.        Left eye: No discharge.     Conjunctiva/sclera: Conjunctivae normal.     Pupils: Pupils are equal, round, and reactive to light.  Cardiovascular:     Rate and Rhythm: Normal rate and regular rhythm.     Heart sounds: No murmur heard.  No friction rub. No gallop.   Pulmonary:     Effort: Pulmonary effort is normal. No respiratory distress.     Breath sounds: Normal breath sounds. No stridor. No rales.  Chest:     Chest wall: Tenderness present.    Abdominal:     General: There is no distension.     Palpations: Abdomen is soft.     Tenderness: There is no abdominal tenderness.  Musculoskeletal:        General: No tenderness.       Cervical back: Normal range of motion and neck supple.  Skin:    General: Skin is warm and dry.     Findings: No erythema or rash.  Neurological:     Mental Status: He is alert and oriented to person, place, and time.     ED Results and Treatments Labs (all labs ordered are listed, but only abnormal results are displayed) Labs Reviewed  BASIC METABOLIC PANEL  CBC  D-DIMER, QUANTITATIVE (NOT AT [facility])  TROPONIN I (HIGH SENSITIVITY)  TROPONIN I (HIGH SENSITIVITY)                                                                                                                         EKG  EKG Interpretation  Date/Time:    Ventricular Rate:    PR Interval:    QRS Duration:   QT Interval:    QTC Calculation:   R Axis:     Text Interpretation:        Radiology DG Chest 2 View  Result Date: 04/10/2020 CLINICAL DATA:  Cough and congestion, chest pain EXAM: CHEST - 2 VIEW COMPARISON:  04/20/2015 FINDINGS: The heart size and mediastinal contours are within normal limits. Both lungs are clear. The visualized skeletal structures are unremarkable. IMPRESSION: No active cardiopulmonary disease. Electronically Signed   By: Sharlet Salina M.D.   On: 04/10/2020 23:45    Pertinent labs & imaging results that were available during my care of the patient were reviewed by me and considered in my medical decision  making (see chart for details).  Medications Ordered in ED Medications - No data to display                                                                                                                                  Procedures Procedures  (including critical care time)  Medical Decision Making / ED Course I have reviewed the nursing notes for this encounter and the patient's prior records (if available in EHR or on provided paperwork).   Frank Frederick was evaluated in Emergency Department on 04/11/2020 for the symptoms described in the history of present illness. He  was evaluated in the context of the global COVID-19 pandemic, which necessitated consideration that the patient might be at risk for infection with the SARS-CoV-2 virus that causes COVID-19. Institutional protocols and algorithms that pertain to the evaluation of patients at risk for COVID-19 are in a state of rapid change based on information released by regulatory bodies including the CDC and federal and state organizations. These policies and algorithms were followed during the patient's care in the ED.  Patient presents with left-sided chest/pectoral pain. Highly inconsistent with ACS.  EKG without acute ischemic changes or evidence of pericarditis.  Troponins drawn in triage were negative. I have low suspicion for pulmonary embolism but given patient's history we discussed obtaining a D-dimer, which was negative.  Do not believe that a CT scan is necessary at this time. Presentation not classic for aortic dissection or esophageal perforation..  Presentation is most consistent with muscular strain.        Final Clinical Impression(s) / ED Diagnoses Final diagnoses:  Pectoralis muscle strain, initial encounter    The patient appears reasonably screened and/or stabilized for discharge and I doubt any other medical condition or other [facility] requiring further screening, evaluation, or treatment in the ED at this time prior to discharge. Safe for discharge with strict return precautions.  Disposition: Discharge  Condition: Good  I have discussed the results, Dx and Tx plan with the patient/family who expressed understanding and agree(s) with the plan. Discharge instructions discussed at length. The patient/family was given strict return precautions who verbalized understanding of the instructions. No further questions at time of discharge.    ED Discharge Orders    None       Follow Up: Primary care provider  Call  As needed    This chart was dictated using voice recognition  software.  Despite best efforts to proofread,  errors can occur which can change the documentation meaning.   Nira Conn, MD 04/11/20 [phone]

## 2020-04-11 NOTE — ED Triage Notes (Signed)
Patient states he started having chest pain around 4 pm today. Patient states that the pain is coming and going.

## 2022-07-23 ENCOUNTER — Emergency Department (HOSPITAL_BASED_OUTPATIENT_CLINIC_OR_DEPARTMENT_OTHER): Payer: 59 | Admitting: Radiology

## 2022-07-23 ENCOUNTER — Encounter (HOSPITAL_BASED_OUTPATIENT_CLINIC_OR_DEPARTMENT_OTHER): Payer: Self-pay

## 2022-07-23 ENCOUNTER — Emergency Department (HOSPITAL_BASED_OUTPATIENT_CLINIC_OR_DEPARTMENT_OTHER)
Admission: EM | Admit: 2022-07-23 | Discharge: 2022-07-24 | Disposition: A | Discharge status: Home / Self Care | Payer: 59 | Attending: Emergency Medicine | Admitting: Emergency Medicine

## 2022-07-23 ENCOUNTER — Other Ambulatory Visit: Payer: Self-pay

## 2022-07-23 DIAGNOSIS — R072 Precordial pain: Secondary | ICD-10-CM | POA: Insufficient documentation

## 2022-07-23 DIAGNOSIS — R079 Chest pain, unspecified: Secondary | ICD-10-CM | POA: Diagnosis not present

## 2022-07-23 LAB — CBC
HCT: 47.5 % (ref 39.0–52.0)
Hemoglobin: 15.8 g/dL (ref 13.0–17.0)
MCH: 28.8 pg (ref 26.0–34.0)
MCHC: 33.3 g/dL (ref 30.0–36.0)
MCV: 86.7 fL (ref 80.0–100.0)
Platelets: 293 10*3/uL (ref 150–400)
RBC: 5.48 MIL/uL (ref 4.22–5.81)
RDW: 13 % (ref 11.5–15.5)
WBC: 8.5 10*3/uL (ref 4.0–10.5)
nRBC: 0 % (ref 0.0–0.2)

## 2022-07-23 LAB — BASIC METABOLIC PANEL
Anion gap: 8 (ref 5–15)
BUN: 18 mg/dL (ref 6–20)
CO2: 28 mmol/L (ref 22–32)
Calcium: 9.7 mg/dL (ref 8.9–10.3)
Chloride: 103 mmol/L (ref 98–111)
Creatinine, Ser: 0.81 mg/dL (ref 0.61–1.24)
GFR, Estimated: >60 mL/min (ref >60)
Glucose, Bld: 89 mg/dL (ref 70–99)
Potassium: 4.1 mmol/L (ref 3.5–5.1)
Sodium: 139 mmol/L (ref 135–145)

## 2022-07-23 LAB — D-DIMER, QUANTITATIVE: D-Dimer, Quant: <0.27 ug/mL-FEU (ref 0.00–0.50)

## 2022-07-23 LAB — TROPONIN I (HIGH SENSITIVITY): Troponin I (High Sensitivity): 4 ng/L (ref <18)

## 2022-07-23 NOTE — ED Provider Notes (Signed)
[facility] EMERGENCY DEPT Provider Note   CSN: [id number] Arrival date & time: 07/23/22  1851     History {Add pertinent medical, surgical, social history, OB history to HPI:1} Chief Complaint  Patient presents with   Chest Pain    Frank Frederick is a 27 y.o. male.  The history is provided by the patient and a parent.  Chest Pain Pain location:  Substernal area Pain quality: sharp   Pain radiates to:  Does not radiate Pain severity:  Moderate Onset quality:  Gradual Timing:  Constant Progression:  Improving Chronicity:  New Relieved by:  Rest Worsened by:  Exertion Associated symptoms: no abdominal pain, no back pain, no cough, no diaphoresis, no numbness, no shortness of breath, no vomiting and no weakness   Patient reports around 5:30 PM today he started having chest pain.  It is substernal and sharp.  Seems to be worse with ambulating.  No associated symptoms.  No fevers or vomiting.  No shortness of breath.  No diaphoresis. No family history of sudden death or early onset cardiac disease He is a non-smoker.  No recent travel or surgery. Patient with history of pulmonary embolism in 2016.  Completed therapy of Xarelto.  No PE since then.  Unclear cause of PEs at that time Patient takes a daily aspirin    Patient reports he works out 2-3 times per week and never has any symptoms.  He reports he took about 2 weeks off for the holidays.  He did not workout strenuously today Home Medications Prior to Admission medications   Medication Sig Start Date End Date Taking? Authorizing Provider  aspirin EC 81 MG tablet Take 81 mg by mouth daily.    [provider]      Allergies    Patient has no known allergies.    Review of Systems   Review of Systems  Constitutional:  Negative for diaphoresis.  Respiratory:  Negative for cough and shortness of breath.   Cardiovascular:  Positive for chest pain. Negative for leg swelling.  Gastrointestinal:   Negative for abdominal pain and vomiting.  Musculoskeletal:  Negative for back pain.  Neurological:  Negative for syncope, weakness and numbness.    Physical Exam Updated Vital Signs BP 100/73   Pulse 65   Temp 97.9 F (36.6 C)   Resp 16   Ht 1.829 m (6')   Wt 64.9 kg   SpO2 100%   BMI 19.40 kg/m  Physical Exam CONSTITUTIONAL: Well developed/well nourished HEAD: Normocephalic/atraumatic EYES: EOMI/PERRL ENMT: Mucous membranes moist NECK: supple no meningeal signs SPINE/BACK:entire spine nontender CV: S1/S2 noted, no murmurs/rubs/gallops noted LUNGS: Lungs are clear to auscultation bilaterally, no apparent distress Chest-no tenderness to palpation ABDOMEN: soft, nontender, no rebound or guarding, bowel sounds noted throughout abdomen GU:no cva tenderness NEURO: Pt is awake/alert/appropriate, moves all extremitiesx4.  No facial droop.  Equal handgrips.  No sensory deficit noted to his extremities.  No focal weakness noted extremities EXTREMITIES: pulses normal/equalx4, full ROM, no lower extremity edema or tenderness SKIN: warm, color normal PSYCH: no abnormalities of mood noted, alert and oriented to situation  ED Results / Procedures / Treatments   Labs (all labs ordered are listed, but only abnormal results are displayed) Labs Reviewed  BASIC METABOLIC PANEL  CBC  D-DIMER, QUANTITATIVE  TROPONIN I (HIGH SENSITIVITY)  TROPONIN I (HIGH SENSITIVITY)    EKG EKG Interpretation  Date/Time:  Wednesday July 23 2022 19:04:49 EST Ventricular Rate:  73 PR Interval:  138 QRS  Duration: 94 QT Interval:  356 QTC Calculation: 392 R Axis:   56 Text Interpretation: Normal sinus rhythm Normal ECG No significant change since last tracing Confirmed by Ripley Fraise [phone]) on 07/23/2022 11:17:03 PM  Radiology DG Chest 2 View  Result Date: 07/23/2022 CLINICAL DATA:  Chest pain. EXAM: CHEST - 2 VIEW COMPARISON:  April 10, 2021 FINDINGS: The heart size and mediastinal  contours are within normal limits. Both lungs are clear. The visualized skeletal structures are unremarkable. IMPRESSION: No active cardiopulmonary disease. Electronically Signed   By: Dorise Bullion III M.D.   On: 07/23/2022 19:31    Procedures Procedures  {Document cardiac monitor, telemetry assessment procedure when appropriate:1}  Medications Ordered in ED Medications - No data to display  ED Course/ Medical Decision Making/ A&P           HEART Score: 0                Medical Decision Making Amount and/or Complexity of Data Reviewed Labs: ordered. Radiology: ordered.   This patient presents to the ED for concern of chest pain, this involves an extensive number of treatment options, and is a complaint that carries with it a high risk of complications and morbidity.  The differential diagnosis includes but is not limited to acute coronary syndrome, aortic dissection, pulmonary embolism, pericarditis, pneumothorax, pneumonia, myocarditis, pleurisy, esophageal rupture    Comorbidities that complicate the patient evaluation: Patient's presentation is complicated by their history of PE   Additional history obtained: Additional history obtained from family Records reviewed previous admission documents and Primary Care Documents  Lab Tests: I Ordered, and personally interpreted labs.  The pertinent results include: Labs overall unremarkable  Imaging Studies ordered: I ordered imaging studies including X-ray chest   I independently visualized and interpreted imaging which showed no acute findings I agree with the radiologist interpretation  Cardiac Monitoring: The patient was maintained on a cardiac monitor.  I personally viewed and interpreted the cardiac monitor which showed an underlying rhythm of:  sinus rhythm  Medicines ordered and prescription drug management: Patient declines pain medicine  Test Considered: Patient is low risk / negative by heart score, therefore do  not feel that cardiac admission is indicated. Wells criteria utilized, no indication for CT chest given negative D-dimer  Reevaluation: After the interventions noted above, I reevaluated the patient and found that they have :{resolved/improved/worsened:23923::"improved"}  Complexity of problems addressed: Patient's presentation is most consistent with  acute presentation with potential threat to life or bodily function  Disposition: After consideration of the diagnostic results and the patient's response to treatment,  I feel that the patent would benefit from {disposition:26850}.     {Document critical care time when appropriate:1} {Document review of labs and clinical decision tools ie heart score, Chads2Vasc2 etc:1}  {Document your independent review of radiology images, and any outside records:1} {Document your discussion with family members, caretakers, and with consultants:1} {Document social determinants of health affecting pt's care:1} {Document your decision making why or why not admission, treatments were needed:1} Final Clinical Impression(s) / ED Diagnoses Final diagnoses:  None    Rx / DC Orders ED Discharge Orders     None

## 2022-07-23 NOTE — ED Triage Notes (Signed)
Patient here POV from Home.  Endorses Mid CP that began approximately at 1730 today intermittently. More Sharp. No fevers. No N/V. No SOB.   History of PE in 2016.. Currently on ASA.   NAD Noted during Triage. A&Ox4. GCS 15. Ambulatory.

## 2022-07-24 LAB — TROPONIN I (HIGH SENSITIVITY): Troponin I (High Sensitivity): 2 ng/L (ref <18)

## 2022-07-24 NOTE — Discharge Instructions (Signed)

## 2022-07-24 NOTE — ED Notes (Signed)
Pt amb with pulse ox, 98% consistently and 60-74 HR.

## 2022-07-31 NOTE — Progress Notes (Signed)
Cardiology Office Note:    Date:  08/04/2022   ID:  Frank Frederick, DOB May 08, 1996, MRN [id number]  PCP:  Merryl Hacker, No   [facility] Providers Cardiologist:  None     Referring MD: Ripley Fraise, MD   Chief Complaint  Patient presents with   Chest Pain    History of Present Illness:    Frank Frederick is a 27 y.o. male is seen at the request of Dr Christy Gentles for evaluation of chest pain. He has a remote history of pulmonary embolism in 2016. CT was positive. LE dopplers negative for DVT.  Treated with Xarelto at that time. No known precipitating cause. Hypercoagulable work up was negative. Recently seen in ED with atypical chest pain. Serial troponins and D dimer were normal. CXR and Ecg were normal. He describes the pain as mid sternal poking feeling. It was off and on for 5 hours. No radiation. No associated SOB. No diaphoresis or palpitations. Since that time has only noted once more and this did not last long. Feels well. Exercises regularly. No history of HTN, DM, or HLD.   Past Medical History:  Diagnosis Date   Medical history non-contributory    PE (pulmonary embolism)     Past Surgical History:  Procedure Laterality Date   ORCHIOPEXY  02/25/2012   Procedure: ORCHIOPEXY ADULT;  Surgeon: Franchot Gallo, MD;  Location: [facility];  Service: Urology;  Laterality: Bilateral;    Current Medications: Current Meds  Medication Sig   aspirin EC 81 MG tablet Take 81 mg by mouth daily.     Allergies:   Patient has no known allergies.   Social History   Socioeconomic History   Marital status: Single    Spouse name: Not on file   Number of children: Not on file   Years of education: Not on file   Highest education level: Not on file  Occupational History   Not on file  Tobacco Use   Smoking status: Never   Smokeless tobacco: Never  Substance and Sexual Activity   Alcohol use: No   Drug use: No   Sexual activity: Not on file  Other Topics Concern   Not on  file  Social History Narrative   Music production   Social Determinants of Health   Financial Resource Strain: Not on file  Food Insecurity: Not on file  Transportation Needs: Not on file  Physical Activity: Not on file  Stress: Not on file  Social Connections: Not on file     Family History: The patient's family history includes Arrhythmia in his mother; Hypertension in his mother.  ROS:   Please see the history of present illness.     All other systems reviewed and are negative.  EKGs/Labs/Other Studies Reviewed:    The following studies were reviewed today: Labs, ECG and CXR from ED  EKG:  EKG is not  ordered today.  The ekg ordered 07/23/22 demonstrates NSR with normal Ecg. I have personally reviewed and interpreted this study.   Recent Labs: 07/23/2022: BUN 18; Creatinine, Ser 0.81; Hemoglobin 15.8; Platelets 293; Potassium 4.1; Sodium 139  Recent Lipid Panel No results found for: "CHOL", "TRIG", "HDL", "CHOLHDL", "VLDL", "LDLCALC", "LDLDIRECT"  Dated 03/20/22: cholesterol 167, triglycerides 39, HDL 59, LDL 100. CBC and CMET normal.  Risk Assessment/Calculations:                Physical Exam:    VS:  BP 110/62   Pulse 74   Ht 6'  1" (1.854 m)   Wt 132 lb 9.6 oz (60.1 kg)   SpO2 96%   BMI 17.49 kg/m     Wt Readings from Last 3 Encounters:  08/04/22 132 lb 9.6 oz (60.1 kg)  07/23/22 143 lb 1.3 oz (64.9 kg)  04/10/20 143 lb (64.9 kg)     GEN:  Well nourished, very thin in no acute distress HEENT: Normal NECK: No JVD; No carotid bruits LYMPHATICS: No lymphadenopathy CARDIAC: RRR, no murmurs, rubs, gallops RESPIRATORY:  Clear to auscultation without rales, wheezing or rhonchi  ABDOMEN: Soft, non-tender, non-distended MUSCULOSKELETAL:  No edema; No deformity  SKIN: Warm and dry NEUROLOGIC:  Alert and oriented x 3 PSYCHIATRIC:  Normal affect   ASSESSMENT:    1. History of pulmonary embolism   2. Atypical chest pain    PLAN:    In order of problems  listed above:  Atypical chest pain. With normal D dimer very unlikely this is recurrent PE. Low risk from a cardiac standpoint. Suspect symptoms more musculoskeletal versus GI. Patient does report some indigestion symptoms at times. For now no additional evaluation needed. Will take a wait and see approach. Follow up PRN           Medication Adjustments/Labs and Tests Ordered: Current medicines are reviewed at length with the patient today.  Concerns regarding medicines are outlined above.  No orders of the defined types were placed in this encounter.  No orders of the defined types were placed in this encounter.   There are no Patient Instructions on file for this visit.   Signed, Mava Suares [place], MD  08/04/2022 8:25 AM    [place]

## 2022-08-04 ENCOUNTER — Encounter: Payer: Self-pay | Admitting: Cardiology

## 2022-08-04 ENCOUNTER — Ambulatory Visit: Payer: 59 | Attending: Cardiology | Admitting: Cardiology

## 2022-08-04 VITALS — BP 110/62 | HR 74 | Ht 73.0 in | Wt 132.6 lb

## 2022-08-04 DIAGNOSIS — R0789 Other chest pain: Secondary | ICD-10-CM

## 2022-08-04 DIAGNOSIS — Z86711 Personal history of pulmonary embolism: Secondary | ICD-10-CM | POA: Diagnosis not present

## 2023-07-21 ENCOUNTER — Ambulatory Visit: Payer: 59 | Admitting: Urgent Care

## 2024-02-16 ENCOUNTER — Ambulatory Visit: Admitting: Internal Medicine

## 2024-03-28 ENCOUNTER — Encounter: Payer: Self-pay | Admitting: Internal Medicine

## 2024-03-28 ENCOUNTER — Other Ambulatory Visit: Payer: Self-pay

## 2024-03-28 ENCOUNTER — Ambulatory Visit: Admitting: Internal Medicine

## 2024-03-28 VITALS — BP 100/64 | HR 79 | Temp 98.2°F | Ht 73.0 in | Wt 134.0 lb

## 2024-03-28 DIAGNOSIS — Z0001 Encounter for general adult medical examination with abnormal findings: Secondary | ICD-10-CM

## 2024-03-28 DIAGNOSIS — Z681 Body mass index (BMI) 19 or less, adult: Secondary | ICD-10-CM

## 2024-03-28 DIAGNOSIS — Q798 Other congenital malformations of musculoskeletal system: Secondary | ICD-10-CM

## 2024-03-28 DIAGNOSIS — Z119 Encounter for screening for infectious and parasitic diseases, unspecified: Secondary | ICD-10-CM

## 2024-03-28 DIAGNOSIS — Z86711 Personal history of pulmonary embolism: Secondary | ICD-10-CM

## 2024-03-28 DIAGNOSIS — R760 Raised antibody titer: Secondary | ICD-10-CM

## 2024-03-28 DIAGNOSIS — Z87438 Personal history of other diseases of male genital organs: Secondary | ICD-10-CM

## 2024-03-28 DIAGNOSIS — Q8740 Marfan's syndrome, unspecified: Secondary | ICD-10-CM

## 2024-03-28 DIAGNOSIS — D6859 Other primary thrombophilia: Secondary | ICD-10-CM

## 2024-03-28 DIAGNOSIS — H9313 Tinnitus, bilateral: Secondary | ICD-10-CM

## 2024-03-28 LAB — CBC WITH DIFFERENTIAL/PLATELET
Basophils Absolute: 0 K/uL (ref 0.0–0.1)
Basophils Relative: 0.8 % (ref 0.0–3.0)
Eosinophils Absolute: 0.1 K/uL (ref 0.0–0.7)
Eosinophils Relative: 2.9 % (ref 0.0–5.0)
HCT: 48.7 % (ref 39.0–52.0)
Hemoglobin: 16 g/dL (ref 13.0–17.0)
Lymphocytes Relative: 29.1 % (ref 12.0–46.0)
Lymphs Abs: 1.5 K/uL (ref 0.7–4.0)
MCHC: 32.9 g/dL (ref 30.0–36.0)
MCV: 85.5 fl (ref 78.0–100.0)
Monocytes Absolute: 0.4 K/uL (ref 0.1–1.0)
Monocytes Relative: 7.3 % (ref 3.0–12.0)
Neutro Abs: 3 K/uL (ref 1.4–7.7)
Neutrophils Relative %: 59.9 % (ref 43.0–77.0)
Platelets: 253 K/uL (ref 150.0–400.0)
RBC: 5.69 Mil/uL (ref 4.22–5.81)
RDW: 13.4 % (ref 11.5–15.5)
WBC: 5.1 K/uL (ref 4.0–10.5)

## 2024-03-28 LAB — LIPID PANEL
Cholesterol: 184 mg/dL (ref 0–200)
HDL: 57.6 mg/dL (ref >39.00)
LDL Cholesterol: 115 mg/dL — ABNORMAL HIGH (ref 0–99)
NonHDL: 126.09
Total CHOL/HDL Ratio: 3
Triglycerides: 57 mg/dL (ref 0.0–149.0)
VLDL: 11.4 mg/dL (ref 0.0–40.0)

## 2024-03-28 LAB — COMPREHENSIVE METABOLIC PANEL WITH GFR
ALT: 15 U/L (ref 0–53)
AST: 19 U/L (ref 0–37)
Albumin: 4.8 g/dL (ref 3.5–5.2)
Alkaline Phosphatase: 41 U/L (ref 39–117)
BUN: 12 mg/dL (ref 6–23)
CO2: 24 meq/L (ref 19–32)
Calcium: 9.8 mg/dL (ref 8.4–10.5)
Chloride: 103 meq/L (ref 96–112)
Creatinine, Ser: 0.81 mg/dL (ref 0.40–1.50)
GFR: 119.07 mL/min (ref >60.00)
Glucose, Bld: 78 mg/dL (ref 70–99)
Potassium: 4 meq/L (ref 3.5–5.1)
Sodium: 143 meq/L (ref 135–145)
Total Bilirubin: 0.9 mg/dL (ref 0.2–1.2)
Total Protein: 7.1 g/dL (ref 6.0–8.3)

## 2024-03-28 LAB — B12 AND FOLATE PANEL
Folate: 18.3 ng/mL (ref >5.9)
Vitamin B-12: 400 pg/mL (ref 211–911)

## 2024-03-28 LAB — HEMOGLOBIN A1C: Hgb A1c MFr Bld: 6 % (ref 4.6–6.5)

## 2024-03-28 LAB — VITAMIN D 25 HYDROXY (VIT D DEFICIENCY, FRACTURES): VITD: 31.51 ng/mL (ref 30.00–100.00)

## 2024-03-28 NOTE — Assessment & Plan Note (Signed)
 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 No recent symptoms of blood clots. No family history of hypercoagulability. Protein C and S levels previously normal. Ordered INR and PTT to monitor clotting status.  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 No current symptoms of pulmonary embolism. Permanent lung damage suspected due to abnormal lung sounds on examination.

## 2024-03-28 NOTE — Assessment & Plan Note (Signed)
 Intermittent tinnitus, possibly related to headphone use. No significant hearing loss or other ear issues reported.

## 2024-03-28 NOTE — Patient Instructions (Signed)
 VISIT SUMMARY: Today, you had your annual physical exam. We discussed your general health, diet, exercise, and sleep habits. Several tests were ordered to evaluate your overall health and specific concerns. We also discussed some potential health issues that need further evaluation.  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 Based on your physical features, we suspect you might have this condition. We have ordered an echocardiogram to check your heart and recommended an eye exam to look for related eye issues.  -PRIMARY THROMBOPHILIA DUE TO LUPUS ANTICOAGULANT: Primary thrombophilia is a condition where your blood has an increased tendency to form clots. This is due to the presence of lupus anticoagulant in your blood. We have ordered INR and PTT tests to monitor your clotting status.  -LOW BODY MASS INDEX (BMI < 20): A low BMI indicates that you are underweight. Despite your efforts to gain weight, you have not seen significant changes. We have ordered a full hormone panel, including testosterone, to check for any hormonal causes of your low BMI.  -HISTORY OF LEFT LOWER LOBE PULMONARY EMBOLISM WITH RESIDUAL LUNG DAMAGE: A pulmonary embolism is a blockage in one of the pulmonary arteries in your lungs. You had one in 2016, which has left some residual lung damage. Although you have no current symptoms, we noted some abnormal lung sounds during the examination.  -HISTORY OF TESTICULAR TORSION, STATUS POST SURGERY: Testicular torsion is a condition where the testicle twists, cutting off its blood supply. You had surgery for this in 2013. We performed a testicular examination today to ensure there are no current issues.  -TINNITUS: Tinnitus is the perception of noise or ringing in the ears. You experience this occasionally,  possibly due to headphone use. There are no significant hearing issues reported.  -GENERAL HEALTH MAINTENANCE: We discussed your general health maintenance, including diet, exercise, and sleep. Routine screenings and vaccinations were recommended. We encourage you to continue your healthy diet and regular exercise.  INSTRUCTIONS: Please follow up with the ordered blood work and the echocardiogram. Schedule an eye exam to check for any Marfan-related issues. Continue with your healthy lifestyle and report any new symptoms or concerns.  Building Your Long-Term Health Plan  During today's preventive visit, we covered a variety of important health checks to help you stay on top of your well-being.  We also discussed strategies to maintain your health and identified some areas that might benefit from further exploration.   Preventive care visits like today's are designed to be proactive, but sometimes additional attention may be needed.  Rest assured, we're here for you.  If these areas require further evaluation or management, we'd be happy to schedule a separate, focused appointment to address them in detail.  Addressing Next Steps  [x]   Follow-up Visit: To ensure we address any unresolved issues and continue monitoring your overall health, we recommend scheduling a follow-up appointment in 1 year for your next preventive care visit. If you experience any new problems, need to discuss any medical concerns, or your condition worsens before then, please don't hesitate to call our office to schedule an appointment or seek emergency care as needed.  [x]   Preventive Measures: Maintaining healthy habits plays a crucial role in overall wellness. We recommend considering these tips: [x]   Regular appointments with dental and vision professionals [x]   Nightly nasal saline mist to keep  sinuses clear [x]   Consistent toothbrushing to maintain oral health [x]   Using an app like SnoreLab to track sleep  quality [x]   Routine checks of blood pressure and heart rate [x]   Medical Information: In some instances, we may require additional medical information from other providers to create a comprehensive picture of your health. If applicable, we can provide a medical information release form at the front desk for you to sign, allowing us  to gather these records. [x]   Lab Tests: If any lab tests were ordered today, scheduling them within a week of your visit helps ensure the best possible insurance coverage.  Planning Follow Up to Work on a Problem? Make the Most of Our Focused (20 minute) Appointments  [x]   Clearly state your top concerns at the beginning of the visit to focus our discussion [x]   If you anticipate you will need more time, please inform the front desk during scheduling - we can book multiple appointments in the same week. [x]   If you have transportation problems- use our convenient video appointments or ask about transportation support. [x]   We can get down to business faster if you use MyChart to update information before the visit and submit non-urgent questions before your visit. Thank you for taking the time to provide details through MyChart.  Let our nurse know and she can import this information into your encounter documents.  Arrival and Wait Times  [x]   Arriving on time ensures that everyone receives prompt attention. [x]   Early morning (8a) and afternoon (1p) appointments tend to have shortest wait times. [x]   Unfortunately, we cannot delay appointments for late arrivals or hold slots during phone calls.  Bring to Your Next Appointment:  [x]   Medications: Please bring all your medication bottles to your next appointment to ensure we have an accurate record of your prescriptions. [x]   Health Diaries: If you're monitoring any health conditions at home, keeping a diary of your readings can be very helpful for discussions at your next appointment.  Reviewing Your  Records  [x]   Review your attached preventive care information at the end of these patient instructions. [x]   Review this early draft of your clinical encounter notes below and the final encounter summary tomorrow on MyChart after its been completed.      Getting Answers and Following Up  [x]   Simple Questions & Concerns: For quick questions or basic follow-up after your visit, reach us  at [phone] or MyChart messaging. [x]   Complex Concerns: If your concern is more complex, scheduling an appointment might be best. Discuss this with the staff to find the most suitable option. [x]   Lab & Imaging Results: We'll contact you directly if results are abnormal or you don't use MyChart. Most normal results will be on MyChart within 2-3 business days, with a review message from Dr. Jesus. Haven't heard back in 2 weeks? Need results sooner? Contact us  at [phone]. [x]   Referrals: Our referral coordinator will manage specialist referrals. The specialist's office should contact you within 2 weeks to schedule an appointment. Call us  if you haven't heard from them after 2 weeks.  Staying Connected  [x]   MyChart: Activate your MyChart for the fastest way to access results and message us . See the last page of this paperwork for instructions on how to activate.  Billing  [x]   X-ray & Lab Orders: These are billed by separate companies. Contact the invoicing company directly for questions or concerns. [x]   Visit Charges: Discuss any billing inquiries  with our administrative services team.  Your Satisfaction Matters  [x]   Share Your Experience: We strive for your satisfaction! If you have any complaints, or preferably compliments, please let Dr. Jesus know directly or contact our Practice Administrators, Manuelita Rubin or [employer], by asking at the front desk.                 Next Steps  [x]   Schedule Follow-Up:  We recommend a follow-up appointment in 1 year for your next  wellness visit.  If you develop any new problems, want to address any medical issues, or your condition worsens before then, please call us  for an appointment or seek emergency care. [x]   Preventive Care:  Make sure to keep regular appointments with dental and vision professionals, use nightly nasal saline mist sprays to keep your sinuses clear and toothbrushing to protect your teeth. Use SnoreLab App or other app to track your sleep quality. Check blood pressure and heart rate routinely. [x]   Medical Information Release:  For any relevant medical information we don't have, please sign a release form at the front desk so we can obtain it for your records. [x]   Lab Tests:  Schedule any lab tests from today for within a week to ensure best insurance coverage.    Making the Most of Our Focused (20 minute) Appointments:  [x]   Clearly state your top concerns at the beginning of the visit to focus our discussion [x]   If you anticipate you will need more time, please inform the front desk during scheduling - we can book multiple appointments in the same week. [x]   If you have transportation problems- use our convenient video appointments or ask about transportation support. [x]   We can get down to business faster if you use MyChart to update information before the visit and submit non-urgent questions before your visit. Thank you for taking the time to provide details through MyChart.  Let our nurse know and she can import this information into your encounter documents.  Arrival and Wait Times: [x]   Arriving on time ensures that everyone receives prompt attention. [x]   Early morning (8a) and afternoon (1p) appointments tend to have shortest wait times. [x]   Unfortunately, we cannot delay appointments for late arrivals or hold slots during phone calls.  Bring to Your Next Appointment  [x]   Medications: Please bring all your medication bottles to your next appointment to ensure we have an accurate record  of your prescriptions. [x]   Health Diaries: If you're monitoring any health conditions at home, keeping a diary of your readings can be very helpful for discussions at your next appointment.  Reviewing Your Records  [x]   Review your attached preventive care information at the end of these patient instructions. [x]   Review this early draft of your clinical encounter notes below and the final encounter summary tomorrow on MyChart after its been completed.   Encounter for annual general medical examination with abnormal findings in adult -     Lipid panel -     Comprehensive metabolic panel with GFR -     CBC with Differential/Platelet -     Hemoglobin A1c -     VITAMIN D  25 Hydroxy (Vit-D Deficiency, Fractures) -     B12 and Folate Panel -     TSH+Prl+FSH+TestT+LH+DHEA S... -     Iron, TIBC and Ferritin Panel -     APTT  Lupus anticoagulant positive -     Protime-INR; Future -  Lupus anticoagulant panel -     APTT -     APTT  Primary hypercoagulable state (HCC) -     Homocysteine -     APTT  Body mass index (BMI) of 19 or less in adult -     Homocysteine -     APTT  Screening examination for infectious disease -     HIV Antibody (routine testing w rflx) -     HCV RNA quant rflx ultra or genotyp -     APTT  Connective tissue anomaly -     Homocysteine -     APTT  Marfan syndrome -     ECHOCARDIOGRAM COMPLETE; Future -     Ambulatory referral to Ophthalmology -     APTT  History of pulmonary embolism  History of torsion of testis  Tinnitus of both ears     Getting Answers and Following Up  [x]   Simple Questions & Concerns: For quick questions or basic follow-up after your visit, reach us  at [phone] or MyChart messaging. [x]   Complex Concerns: If your concern is more complex, scheduling an appointment might be best. Discuss this with the staff to find the most suitable option. [x]   Lab & Imaging Results: We'll contact you directly if results are abnormal  or you don't use MyChart. Most normal results will be on MyChart within 2-3 business days, with a review message from Dr. Jesus. Haven't heard back in 2 weeks? Need results sooner? Contact us  at [phone]. [x]   Referrals: Our referral coordinator will manage specialist referrals. The specialist's office should contact you within 2 weeks to schedule an appointment. Call us  if you haven't heard from them after 2 weeks.  Staying Connected  [x]   MyChart: Activate your MyChart for the fastest way to access results and message us . See the last page of this paperwork for instructions on how to activate.  Billing  [x]   X-ray & Lab Orders: These are billed by separate companies. Contact the invoicing company directly for questions or concerns. [x]   Visit Charges: Discuss any billing inquiries with our administrative services team.  Your Satisfaction Matters  [x]   Share Your Experience: We strive for your satisfaction! If you have any complaints, or preferably compliments, please let Dr. Jesus know directly or contact our Practice Administrators, Manuelita Rubin or [employer], by asking at the front desk.    Medical Screening Exam A medical screening exam (MSE) helps to determine whether you need immediate medical treatment relating to any number of symptoms you are having. This type of exam may be done in an emergency department, an urgent care setting, or your health care provider's office. Depending on your symptoms and severity, you may need additional tests or medical therapy. It is important to note that an MSE does not necessarily mean that you will need or receive further medical testing or interventions if your symptoms are not deemed to be medically urgent (emergent). Tell a health care provider about: Any allergies you have. All medicines you are taking, including vitamins, herbs, eye drops, creams, and over-the-counter medicines. Any problems you or family members have had with  anesthetic medicines. Any bleeding problems you have. Any surgeries you have had. Any medical conditions you have. Whether you are pregnant or may be pregnant. What happens during the test? During the exam, a health care provider does a short, often focused, physical exam and asks about your medical history to assess: Your current symptoms. Your overall health. Your  need for possible further medical intervention. What can I expect after the test? If you have a regular health care provider, make an appointment for a follow-up visit with him or her. If you do not have a regular health care provider, ask about resources in your community. Your medical screening exam may determine that: You do not need emergency treatment at this time. You need treatment right away. You need to be transferred to another medical center. This may happen if you need an emergent specialist or consultant that is not available at the medical center you are at. You need to have more tests. A medical specialist may be consulted if needed. Get help right away if: Your condition gets worse. You develop new or troubling symptoms before you see your health care provider. These symptoms may represent a serious problem that is an emergency. Do not wait to see if the symptoms will go away. Get medical help right away. Call your local emergency services (911 in the [place] Do not drive yourself to the hospital. Summary A medical screening exam helps to determine whether you need medical treatment right away. This type of exam may be done in an emergency department, an urgent care setting, or your health care provider's office. During the exam, a health care provider does a short physical exam and asks about your current symptoms and overall health. Depending on the exam, more tests or therapies may be ordered. However, an MSE does not necessarily mean that you will have further medical testing if your symptoms are not deemed to  be urgent. If you need further care that is not offered at your current medical center, you may need to be transferred to another facility. This information is not intended to replace advice given to you by your health care provider. Make sure you discuss any questions you have with your health care provider. Document Revised: 03/20/2021 Document Reviewed: 11/15/2020 Elsevier Patient Education  2024 [employer].

## 2024-03-28 NOTE — Assessment & Plan Note (Signed)
 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 No family history of sudden cardiac death, aneurysm, or connective tissue disease. No eye problems or known root dilation. Clinical diagnosis considered but not confirmed. Discussed potential risks of undiagnosed Marfan syndrome, including early death from blood vessel rupture. Ordered echocardiogram to assess for Marfan syndrome and recommended eye exam to check for Marfan-related ocular issues.

## 2024-03-28 NOTE — Assessment & Plan Note (Signed)
 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 No current testicular issues reported. Considered potential link to connective tissue disorder. Perform testicular examination to assess current status.

## 2024-03-28 NOTE — Progress Notes (Signed)
 [facility]  Phone: [phone]  -- Annual Preventive Medical Office Visit --  Purpose of Visit: Comprehensive preventive health assessment and personalized health maintenance planning.  This encounter was conducted as a Comprehensive Physical Exam (CPE) preventive care annual visit. The patient's medical history and problem list were reviewed to inform individualized preventive care recommendations.   No problem-specific medical treatment was provided during this visit.  Visit Date: 03/28/2024 Patient: Frank Frederick   DOB: 01-13-1996   28 y.o. Male  MRN: [id number] Patient Care Team: Jesus Bernardino MATSU, MD as PCP - General (Internal Medicine) Today's Health Care Provider: Bernardino MATSU Jesus, MD  ===========================================    Chief Complaint / Reason for Visit: New pt (Pt is present to est care with pcp would like some blood work today pt is fasting Testorone levels as well.)   Background: 28 y.o. male who has Body mass index (BMI) of 19 or less in adult; Primary hypercoagulable state (HCC); Lupus anticoagulant positive; Connective tissue anomaly; Marfan syndrome; History of pulmonary embolism; History of torsion of testis; and Tinnitus of both ears on their problem list.  Discussed the use of AI scribe software for clinical note transcription with the patient, who gave verbal consent to proceed.  History of Present Illness 28 year old male who presents for an annual physical exam.  He has a history of hypercoagulability and lupus anticoagulant, with a past pulmonary embolism in 2016 located in the lower part of the left lung. No current symptoms such as swelling or tenderness in the legs or arms. He is not on anticoagulant therapy and does not recall recent testing for his clotting profile. Previous tests for protein C and S were normal, and he has a history of an abnormal DRVVT test. No family history of hypercoagulability.  He experienced  testicular torsion at age 3, leading to surgery in 2013. The torsion was discovered during a choir rehearsal when he experienced sharp discomfort in the groin area. He still has both testicles and describes them as average in size.  He reports being of low body weight and has struggled to gain weight despite attempts to increase caloric intake and exercise. He exercises two to three times a week, focusing on calisthenics and mobility stretches. His diet consists mainly of eggs, fruit, whole grain bread, and vegetables. A family history of thinness, with relatives who were initially thin but later gained weight.  No current symptoms of depression, swelling, or rashes. He reports occasional tinnitus, especially noticeable when going to bed. No significant childhood medical issues aside from a fracture in his arm.  He reports a family history of arrhythmia and high blood pressure, with no known history of sudden cardiac death, aneurysm, or connective tissue disease. No eye problems or scoliosis. He has a high arched palate and joint hypermobility, with a slight pectus deformity of the chest. He is approximately 6 feet tall and has a long wingspan.  He sleeps an average of six to seven hours per night and feels rested upon waking. No drowsiness during the day.  Problem overviews updated today: Problem  Connective Tissue Anomaly   High arched palate, hyperflexible, long wingspan,  long limbs History of spontaneous testicle torsion, history of being very thin. Pectus deformity.   Marfan Syndrome  History of Pulmonary Embolism  History of Torsion of Testis  Tinnitus of Both Ears  Pulmonary Emboli (Hcc) (Resolved)  Pulmonary Embolism (Hcc) (Resolved)  Torsion, Testicular (Resolved)   History of in August 2013,  corrected surgically by L orchipexy    Medications updated/reviewed: Current Outpatient Medications on File Prior to Visit  Medication Sig   aspirin EC 81 MG tablet Take 81 mg by mouth  daily.   No current facility-administered medications on file prior to visit.  There are no discontinued medications. Current Meds  Medication Sig   aspirin EC 81 MG tablet Take 81 mg by mouth daily.   Allergies:  Patient has no known allergies. Past Medical History:  has a past medical history of Medical history non-contributory, PE (pulmonary embolism), Pulmonary emboli (HCC) (04/20/2015), Pulmonary embolism (HCC) (04/20/2015), and Torsion, testicular (02/17/2013). Past Surgical History:   has a past surgical history that includes Orchiopexy (02/25/2012). Social History:   reports that he has never smoked. He has never used smokeless tobacco. He reports that he does not drink alcohol and does not use drugs. Family History:  family history includes Arrhythmia in his mother; Hypertension in his mother. Depression Screen and Health Maintenance:    03/28/2024    9:01 AM 02/17/2013    6:29 PM  PHQ 2/9 Scores  PHQ - 2 Score 0 0   Health Maintenance  Topic Date Due   HIV Screening  Never done   Hepatitis C Screening  Never done   HPV VACCINES (1 - 3-dose SCDM series) Never done   Influenza Vaccine  02/19/2024   COVID-19 Vaccine (1 - 2024-25 season) Never done   DTaP/Tdap/Td (8 - Td or Tdap) 03/20/2031   Hepatitis B Vaccines 19-59 Average Risk  Completed   Pneumococcal Vaccine  Aged Out   Meningococcal B Vaccine  Aged Out   Immunization History  Administered Date(s) Administered   DTaP 11/05/1994, 12/24/1994, 04/27/1995, 06/13/1996   HIB (PRP-OMP) 11/05/1994, 12/24/1994, 04/27/1995, 06/13/1996   Hepatitis A 08/05/2006, 03/19/2009   Hepatitis B 08/21/1994, 11/05/1994, 08/17/1995   IPV 11/05/1994, 12/24/1994, 04/27/1995, 02/12/2004   Influenza Nasal 05/20/2005   Influenza,inj,Quad PF,6+ Mos 06/22/2017   MMR 06/13/1996, 01/23/2004   Meningococcal Conjugate 08/05/2006   Td 02/12/2004, 03/19/2021   Tdap 03/19/2009   Varicella 06/13/1996   has Body mass index (BMI) of 19 or less in  adult; Primary hypercoagulable state (HCC); Lupus anticoagulant positive; Connective tissue anomaly; Marfan syndrome; History of pulmonary embolism; History of torsion of testis; and Tinnitus of both ears on their problem list.   Objective   Physical ExamBP 100/64   Pulse 79   Temp 98.2 F (36.8 C) (Temporal)   Ht 6' 1 (1.854 m)   Wt 134 lb (60.8 kg)   SpO2 99%   BMI 17.68 kg/m  Wt Readings from Last 10 Encounters:  03/28/24 134 lb (60.8 kg)  08/04/22 132 lb 9.6 oz (60.1 kg)  07/23/22 143 lb 1.3 oz (64.9 kg)  04/10/20 143 lb (64.9 kg)  07/24/18 137 lb (62.1 kg)  08/28/16 131 lb 6.4 oz (59.6 kg)  01/10/16 134 lb 12.8 oz (61.1 kg)  10/10/15 131 lb 12.8 oz (59.8 kg)  09/07/15 129 lb 1.6 oz (58.6 kg)  09/04/15 130 lb (59 kg)  Vital signs reviewed.  Nursing notes reviewed. Weight trend reviewed. Long arm length, hyperextensible/flexible, high arched palate, pectus excavatum  Physical Exam HEENT: Cerumen impaction 10-20% in left ears, total in right. Normal oropharynx with high arched palate. Eyes normal on inspection. NECK: Neck normal on palpation. CHEST: Abnormal lung sounds on auscultation. EXTREMITIES: Fingertips normal on inspection. MUSCULOSKELETAL: Spine normal on inspection. Joint hypermobility noted. Slight pectus deformity noted.  GEN: No acute distress, resting comfortably.  HEENT: Tympanic membranes normal appearing bilaterally, oropharynx clear, no thyromegaly noted, no palpable lymphadenopathy or thyroid nodules. CARDIOVASCULAR: S1 and S2 heart sounds with regular rate and rhythm, no murmurs appreciated. PULMONARY: Normal work of breathing, clear to auscultation bilaterally, no crackles, wheezes, or rhonchi. ABDOMEN: Soft, nontender, nondistended. MSK: No edema, cyanosis, or clubbing noted. SKIN: Warm, dry, no lesions of concern observed. NEUROLOGICAL: Cranial nerves II-XII grossly intact, strength 5/5 in upper and lower extremities, reflexes symmetric and intact  bilaterally. PSYCH: Normal affect and thought content, pleasant and cooperative.  Results for orders placed or performed in visit on 03/28/24  Lipid panel  Result Value Ref Range   Cholesterol 184 0 - 200 mg/dL   Triglycerides 42.9 0.0 - 149.0 mg/dL   HDL 42.39 >60.99 mg/dL   VLDL 88.5 0.0 - 59.9 mg/dL   LDL Cholesterol 884 (H) 0 - 99 mg/dL   Total CHOL/HDL Ratio 3    NonHDL 126.09   Comprehensive metabolic panel with GFR  Result Value Ref Range   Sodium 143 135 - 145 mEq/L   Potassium 4.0 3.5 - 5.1 mEq/L   Chloride 103 96 - 112 mEq/L   CO2 24 19 - 32 mEq/L   Glucose, Bld 78 70 - 99 mg/dL   BUN 12 6 - 23 mg/dL   Creatinine, Ser 9.18 0.40 - 1.50 mg/dL   Total Bilirubin 0.9 0.2 - 1.2 mg/dL   Alkaline Phosphatase 41 39 - 117 U/L   AST 19 0 - 37 U/L   ALT 15 0 - 53 U/L   Total Protein 7.1 6.0 - 8.3 g/dL   Albumin 4.8 3.5 - 5.2 g/dL   GFR 880.92 >39.99 mL/min   Calcium 9.8 8.4 - 10.5 mg/dL  CBC with Differential/Platelet  Result Value Ref Range   WBC 5.1 4.0 - 10.5 K/uL   RBC 5.69 4.22 - 5.81 Mil/uL   Hemoglobin 16.0 13.0 - 17.0 g/dL   HCT 51.2 60.9 - 47.9 %   MCV 85.5 78.0 - 100.0 fl   MCHC 32.9 30.0 - 36.0 g/dL   RDW 86.5 88.4 - 84.4 %   Platelets 253.0 150.0 - 400.0 K/uL   Neutrophils Relative % 59.9 43.0 - 77.0 %   Lymphocytes Relative 29.1 12.0 - 46.0 %   Monocytes Relative 7.3 3.0 - 12.0 %   Eosinophils Relative 2.9 0.0 - 5.0 %   Basophils Relative 0.8 0.0 - 3.0 %   Neutro Abs 3.0 1.4 - 7.7 K/uL   Lymphs Abs 1.5 0.7 - 4.0 K/uL   Monocytes Absolute 0.4 0.1 - 1.0 K/uL   Eosinophils Absolute 0.1 0.0 - 0.7 K/uL   Basophils Absolute 0.0 0.0 - 0.1 K/uL  HgB A1c  Result Value Ref Range   Hgb A1c MFr Bld 6.0 4.6 - 6.5 %  Vitamin D  (25 hydroxy)  Result Value Ref Range   VITD 31.51 30.00 - 100.00 ng/mL  B12 and Folate Panel  Result Value Ref Range   Vitamin B-12 400 211 - 911 pg/mL   Folate 18.3 >5.9 ng/mL    Office Visit on 03/28/2024  Component Date Value    Cholesterol 03/28/2024 184    Triglycerides 03/28/2024 57.0    HDL 03/28/2024 57.60    VLDL 03/28/2024 11.4    LDL Cholesterol 03/28/2024 115 (H)    Total CHOL/HDL Ratio 03/28/2024 3    NonHDL 03/28/2024 126.09    Sodium 03/28/2024 143    Potassium 03/28/2024 4.0    Chloride 03/28/2024 103  CO2 03/28/2024 24    Glucose, Bld 03/28/2024 78    BUN 03/28/2024 12    Creatinine, Ser 03/28/2024 0.81    Total Bilirubin 03/28/2024 0.9    Alkaline Phosphatase 03/28/2024 41    AST 03/28/2024 19    ALT 03/28/2024 15    Total Protein 03/28/2024 7.1    Albumin 03/28/2024 4.8    GFR 03/28/2024 119.07    Calcium 03/28/2024 9.8    WBC 03/28/2024 5.1    RBC 03/28/2024 5.69    Hemoglobin 03/28/2024 16.0    HCT 03/28/2024 48.7    MCV 03/28/2024 85.5    MCHC 03/28/2024 32.9    RDW 03/28/2024 13.4    Platelets 03/28/2024 253.0    Neutrophils Relative % 03/28/2024 59.9    Lymphocytes Relative 03/28/2024 29.1    Monocytes Relative 03/28/2024 7.3    Eosinophils Relative 03/28/2024 2.9    Basophils Relative 03/28/2024 0.8    Neutro Abs 03/28/2024 3.0    Lymphs Abs 03/28/2024 1.5    Monocytes Absolute 03/28/2024 0.4    Eosinophils Absolute 03/28/2024 0.1    Basophils Absolute 03/28/2024 0.0    Hgb A1c MFr Bld 03/28/2024 6.0    VITD 03/28/2024 31.51    Vitamin B-12 03/28/2024 400    Folate 03/28/2024 18.3    No image results found.   No results found.  DG Chest 2 View Result Date: 07/23/2022 CLINICAL DATA:  Chest pain. EXAM: CHEST - 2 VIEW COMPARISON:  April 10, 2021 FINDINGS: The heart size and mediastinal contours are within normal limits. Both lungs are clear. The visualized skeletal structures are unremarkable. IMPRESSION: No active cardiopulmonary disease. Electronically Signed   By: Alm Pouch III M.D.   On: 07/23/2022 19:31      ASSESSMENT & PLAN   Assessment & Plan Encounter for annual general medical examination with abnormal findings in adult Screening examination for  infectious disease Adult Wellness Visit   Routine annual physical examination revealed several abnormalities requiring further evaluation. Discussed general health, diet, exercise, and sleep habits. Ordered blood work including HIV and hepatitis C screening, cholesterol, metabolic panel for kidneys and liver, blood count, thyroid, INR, PTT, vitamin D , B12, folate, and a full hormone panel including testosterone.   General Health Maintenance   Discussed general health maintenance including diet, exercise, and sleep. Recommended routine screenings and vaccinations. Recommend routine eye exam and encourage continuation of healthy diet and regular exercise.  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 No recent symptoms of blood clots. No family history of hypercoagulability. Protein C and S levels previously normal. Ordered INR and PTT to monitor clotting status.  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 No current symptoms of pulmonary embolism. Permanent lung damage suspected due to abnormal lung sounds on examination. Body mass index (BMI) of 19 or less in adult Low body mass index (BMI < 20)   Chronic low BMI with no significant malnutrition signs. Diet includes eggs, fruit, whole grains, and vegetables. Exercises regularly. Family history of thinness. No recent weight gain despite efforts to increase caloric intake. Ordered full hormone panel including testosterone to assess for potential hormonal causes of low BMI. 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 No family history of sudden cardiac death, aneurysm, or connective tissue disease. No eye problems or known root dilation. Clinical diagnosis considered but not confirmed. Discussed potential risks of undiagnosed Marfan syndrome, including early death from blood vessel rupture. Ordered echocardiogram to assess for Marfan syndrome and recommended eye exam to check for Marfan-related ocular issues. History of torsion of testis History of testicular torsion, status post surgery , spontaneous suggestive of ?connective tissue disorder  Testicular torsion with surgical intervention in 2013. No current testicular issues reported. Considered potential link to connective tissue disorder. Perform testicular examination to assess current status. Tinnitus of both ears Intermittent tinnitus, possibly related to headphone use. No significant hearing loss or other ear issues reported.     ORDER ASSOCIATIONS  #   DIAGNOSIS / CONDITION ICD-10 ENCOUNTER ORDER     ICD-10-CM   1. Encounter for annual general medical examination with abnormal findings in adult  Z00.01 Lipid panel    Comprehensive metabolic panel with GFR    CBC with Differential/Platelet    HgB A1c    Vitamin D  (25 hydroxy)    B12 and Folate Panel    TSH+Prl+FSH+TestT+LH+DHEA S...    Iron, TIBC and Ferritin Panel    PTT    2. Lupus anticoagulant positive  R76.0 Protime-INR    Lupus anticoagulant panel( [facility])    PTT    Protime-INR    PTT    3. Primary hypercoagulable state (HCC)  D68.59 Homocysteine    PTT    4. Body mass index (BMI) of 19 or less in adult  Z68.1 Homocysteine    PTT    5. Screening examination for infectious disease  Z11.9 HIV Antibody (routine testing w rflx)    HCV RNA quant rflx ultra or genotyp    PTT    6. Connective tissue anomaly  Q79.8 Homocysteine    PTT    7.  Marfan syndrome  Q87.40 ECHOCARDIOGRAM COMPLETE    Ambulatory referral to Ophthalmology    PTT    8. History of pulmonary embolism  Z86.711     9. History of torsion of testis  Z87.438     10. Tinnitus of both ears  H93.13      ED Discharge Orders          Ordered    Lipid panel       Comments: Specimen [id number]: [place]    03/28/24 0934    Comprehensive metabolic panel with GFR        03/28/24 0934    CBC with Differential/Platelet        03/28/24 0934    HIV Antibody (routine testing w rflx)        03/28/24 0934    HCV RNA quant rflx ultra or genotyp        03/28/24 0934    HgB A1c        03/28/24 0934    Protime-INR        03/28/24 0934    Lupus anticoagulant panel( [facility])        03/28/24 0934    PTT        03/28/24 0934    Vitamin D  (25 hydroxy)        03/28/24 0934    B12 and Folate Panel        03/28/24 0934    TSH+Prl+FSH+TestT+LH+DHEA  S...        03/28/24 0934    Iron, TIBC and Ferritin Panel        03/28/24 0934    Homocysteine        03/28/24 0955    ECHOCARDIOGRAM COMPLETE       Comments: Patient presents with multiple skeletal features suggestive of connective tissue disorder:  High arched palate Hyperflexibility Increased wingspan Long limbs Pectus deformity History of spontaneous testicular torsion History of being very thin Please evaluate for aortic root dilation and other features consistent with Marfan syndrome.   03/28/24 0955    Ambulatory referral to Ophthalmology       Comments: Referral Reason: Evaluation for connective tissue disorder (Marfan syndrome or related disorder)  Comments:  Patient presents with multiple skeletal features suggestive of a connective tissue disorder, including:  High arched palate Hyperflexibility Increased wingspan Long limbs Pectus deformity History of spontaneous testicular torsion History of being very thin No known ocular history. Please evaluate for ectopia lentis  or other ocular manifestations associated with Marfan syndrome or related connective tissue disorders.  You may also add:  Family history is negative for connective tissue disorders or Marfan syndrome.   03/28/24 0955             Ordered    PTT        03/28/24 1346            Future Appointments  Date Time Provider Department Center  03/29/2025  9:00 AM Jesus Bernardino MATSU, MD [facility]         Reviewed/updated/encouraged completion: Immunization History  Administered Date(s) Administered   DTaP 11/05/1994, 12/24/1994, 04/27/1995, 06/13/1996   HIB (PRP-OMP) 11/05/1994, 12/24/1994, 04/27/1995, 06/13/1996   Hepatitis A 08/05/2006, 03/19/2009   Hepatitis B 08/21/1994, 11/05/1994, 08/17/1995   IPV 11/05/1994, 12/24/1994, 04/27/1995, 02/12/2004   Influenza Nasal 05/20/2005   Influenza,inj,Quad PF,6+ Mos 06/22/2017   MMR 06/13/1996, 01/23/2004   Meningococcal Conjugate 08/05/2006   Td 02/12/2004, 03/19/2021   Tdap 03/19/2009   Varicella 06/13/1996   Health Maintenance Due  Topic Date Due   HIV Screening  Never done   Hepatitis C Screening  Never done   HPV VACCINES (1 - 3-dose SCDM series) Never done   Influenza Vaccine  02/19/2024   COVID-19 Vaccine (1 - 2024-25 season) Never done   Health Maintenance  Topic Date Due   HIV Screening  Never done   Hepatitis C Screening  Never done   HPV VACCINES (1 - 3-dose SCDM series) Never done   Influenza Vaccine  02/19/2024   COVID-19 Vaccine (1 - 2024-25 season) Never done   DTaP/Tdap/Td (8 - Td or Tdap) 03/20/2031   Hepatitis B Vaccines 19-59 Average Risk  Completed   Pneumococcal Vaccine  Aged Out   Meningococcal B Vaccine  Aged Out    Reviewed the following verbally with patient and provided AVS materials:   HEALTH MAINTENANCE COUNSELING AND ANTICIPATORY GUIDANCE    Preventive Measure Recommendation  Eye Exams Every 1-2 years  Dental Care Cleanings every 6 months or more, brush/floss 3x daily  Sinus Care  Saline spray rinses daily  Sleep 8 hours nightly, good sleep hygiene, e-monitoring if any daytime drowsiness  Diet Fruits/vegetables/fiber/healthy fats, balance and moderation  Exercise 150 minutes weekly  Risk Behaviors Discouraged any/all high risk behaviors    CANCER SCREENING SHARED DECISION MAKING    Penile/Testicle/Scrotum Encouraged self-monitoring and reporting of genital abnormalities. Patient reports none.  Thyroid Checked and advised to palpate  thyroid for nodules  Prostate Individualized risks/benefits/costs discussed No results found for: PSA  Colon HM Colonoscopy ; no family history or gastrointestinal bleeding  This patient has no relevant Health Maintenance data.     Lung Current guidelines recommend individuals aged 50 to 65 who currently smoke or formerly smoked and have a >= 20 pack-year smoking history should undergo annual screening with low-dose computed tomography (LDCT). Tobacco Use: Low Risk  (03/28/2024)   Patient History    Smoking Tobacco Use: Never    Smokeless Tobacco Use: Never    Passive Exposure: Not on file    Skin Advised regular sunscreen use. Patient denies worrisome, changing, or new skin lesions.    ROS A comprehensive ROS was negative for any concerning symptoms.   Completed medication reconciliation: Current Outpatient Medications on File Prior to Visit  Medication Sig   aspirin EC 81 MG tablet Take 81 mg by mouth daily.   No current facility-administered medications on file prior to visit.  There are no discontinued medications.The following were reviewed and/or entered/updated into our electronic MEDICAL RECORD NUMBERPast Medical History:  Diagnosis Date   Medical history non-contributory    PE (pulmonary embolism)    Pulmonary emboli (HCC) 04/20/2015   Pulmonary embolism (HCC) 04/20/2015   Torsion, testicular 02/17/2013   History of in August 2013, corrected surgically by L orchipexy     Past Surgical History:  Procedure Laterality  Date   ORCHIOPEXY  02/25/2012   Procedure: ORCHIOPEXY ADULT;  Surgeon: Garnette Shack, MD;  Location: [facility];  Service: Urology;  Laterality: Bilateral;   Social History   Socioeconomic History   Marital status: Single    Spouse name: Not on file   Number of children: Not on file   Years of education: Not on file   Highest education level: Not on file  Occupational History   Not on file  Tobacco Use   Smoking status: Never   Smokeless tobacco: Never  Substance and Sexual Activity   Alcohol use: No   Drug use: No   Sexual activity: Not on file  Other Topics Concern   Not on file  Social History Narrative   Music production   Social Drivers of Health   Financial Resource Strain: Low Risk  (03/20/2022)   [facility]   Overall Financial Resource Strain (CARDIA)    Difficulty of Paying Living Expenses: Not hard at all  Food Insecurity: No Food Insecurity (03/20/2022)   [facility]   Hunger Vital Sign    Within the past 12 months, you worried that your food would run out before you got the money to buy more.: Never true    Within the past 12 months, the food you bought just didn't last and you didn't have money to get more.: Never true  Transportation Needs: No Transportation Needs (03/20/2022)   Received from [facility] - Transportation    Lack of Transportation (Medical): No    Lack of Transportation (Non-Medical): No  Physical Activity: Insufficiently Active (03/20/2022)   Received from [facility]   Exercise Vital Sign    On average, how many days per week do you engage in moderate to strenuous exercise (like a brisk walk)?: 4 days    On average, how many minutes do you engage in exercise at this level?: 20 min  Stress: No Stress Concern Present (03/20/2022)   Received from [facility] - Occupational Stress  Questionnaire    Feeling of Stress : Only a little  Social Connections:  Unknown (12/02/2021)   Received from [facility]   Social Network    Social Network: Not on file  Intimate Partner Violence: Unknown (10/24/2021)   Received from [facility]   HITS    Physically Hurt: Not on file    Insult or Talk Down To: Not on file    Threaten Physical Harm: Not on file    Scream or Curse: Not on file    Family History  Problem Relation Age of Onset   Arrhythmia Mother    Hypertension Mother   No Known Allergies Social History   Substance and Sexual Activity  Sexual Activity Not on file  @    03/28/2024    9:01 AM  Depression screen PHQ 2/9  Decreased Interest 0  Down, Depressed, Hopeless 0  PHQ - 2 Score 0    Last CBC Lab Results  Component Value Date   WBC 5.1 03/28/2024   HGB 16.0 03/28/2024   HCT 48.7 03/28/2024   MCV 85.5 03/28/2024   MCH 28.8 07/23/2022   RDW 13.4 03/28/2024   PLT 253.0 03/28/2024   Last metabolic panel Lab Results  Component Value Date   GLUCOSE 78 03/28/2024   NA 143 03/28/2024   K 4.0 03/28/2024   CL 103 03/28/2024   CO2 24 03/28/2024   BUN 12 03/28/2024   CREATININE 0.81 03/28/2024   GFR 119.07 03/28/2024   CALCIUM 9.8 03/28/2024   PROT 7.1 03/28/2024   ALBUMIN 4.8 03/28/2024   BILITOT 0.9 03/28/2024   ALKPHOS 41 03/28/2024   AST 19 03/28/2024   ALT 15 03/28/2024   ANIONGAP 8 07/23/2022   Last lipids Lab Results  Component Value Date   CHOL 184 03/28/2024   HDL 57.60 03/28/2024   LDLCALC 115 (H) 03/28/2024   TRIG 57.0 03/28/2024   CHOLHDL 3 03/28/2024   Last hemoglobin A1c Lab Results  Component Value Date   HGBA1C 6.0 03/28/2024   Last thyroid functions No results found for: TSH, T3TOTAL, T4TOTAL, THYROIDAB Last vitamin D  Lab Results  Component Value Date   VD25OH 31.51 03/28/2024   Last vitamin B12 and Folate Lab Results  Component Value Date   VITAMINB12 400 03/28/2024   FOLATE 18.3 03/28/2024        ======================================  IMPORTANT HEALTH  REMINDERS: Report any new or changing skin lesions promptly Maintain recommended screening schedules Discuss any new family history of cancer at future visits Follow up on any new symptoms that persist more than two weeks      Notes:  This document was synthesized by artificial intelligence ([employer]) using HIPAA-compliant recording of the clinical interaction;   We discussed the use of AI scribe software for clinical note transcription with the patient, who gave verbal consent to proceed.    This encounter employed state-of-the-art, real-time, collaborative documentation. The patient was empowered to actively review and assist in updating their electronic medical record on a shared monitor, ensuring transparency and improving accuracy.    Prior to and at the beginning of Comprehensive Physical Exam (CPE) preventive care annual visit appointment types  we clarify to patients Our goal today is to focus on your preventive or annual Comprehensive Physical Exam (CPE) preventive care annual visit, which typically covers routine screenings and overall health maintenance. However, if you share any new or concerning symptoms--such as dizziness, passing out, severe pain, or anything else that may point to a more serious  issue--we are both legally and ethically required to evaluate it. We cannot simply overlook or ignore such concerns, even if you later decide you don't want to discuss them, because it could jeopardize your health.  If addressing a new concern takes us  beyond the scope of the preventive visit, we may need to bill separately for that portion of care. We understand financial considerations are important, and we're happy to discuss your options if something new comes up. However, we want to be clear that once you mention a potentially serious issue, we must investigate it; we can't ethically or legally exclude that from our records or our evaluation. Please let us  know all of your questions or  worries. Together, we can decide how best to manage them and how to minimize any unexpected costs, but we want to keep you safe above all else.   This disclosure is mandated by professional ethics and legal obligations, as healthcare providers must address any substantial health concerns raised during any patient interaction and a comprehensive ROS is required by insurance companies for billing preventive-care visit type.   This disclosure ultimately discourages patients financially from reporting significant health issues.   Medical Screening Exam A medical screening exam (MSE) helps to determine whether you need immediate medical treatment relating to any number of symptoms you are having. This type of exam may be done in an emergency department, an urgent care setting, or your health care provider's office. Depending on your symptoms and severity, you may need additional tests or medical therapy. It is important to note that an MSE does not necessarily mean that you will need or receive further medical testing or interventions if your symptoms are not deemed to be medically urgent (emergent). Tell a health care provider about: Any allergies you have. All medicines you are taking, including vitamins, herbs, eye drops, creams, and over-the-counter medicines. Any problems you or family members have had with anesthetic medicines. Any bleeding problems you have. Any surgeries you have had. Any medical conditions you have. Whether you are pregnant or may be pregnant. What happens during the test? During the exam, a health care provider does a short, often focused, physical exam and asks about your medical history to assess: Your current symptoms. Your overall health. Your need for possible further medical intervention. What can I expect after the test? If you have a regular health care provider, make an appointment for a follow-up visit with him or her. If you do not have a regular health care  provider, ask about resources in your community. Your medical screening exam may determine that: You do not need emergency treatment at this time. You need treatment right away. You need to be transferred to another medical center. This may happen if you need an emergent specialist or consultant that is not available at the medical center you are at. You need to have more tests. A medical specialist may be consulted if needed. Get help right away if: Your condition gets worse. You develop new or troubling symptoms before you see your health care provider. These symptoms may represent a serious problem that is an emergency. Do not wait to see if the symptoms will go away. Get medical help right away. Call your local emergency services (911 in the [place] Do not drive yourself to the hospital. Summary A medical screening exam helps to determine whether you need medical treatment right away. This type of exam may be done in an emergency department, an urgent care setting, or  your health care provider's office. During the exam, a health care provider does a short physical exam and asks about your current symptoms and overall health. Depending on the exam, more tests or therapies may be ordered. However, an MSE does not necessarily mean that you will have further medical testing if your symptoms are not deemed to be urgent. If you need further care that is not offered at your current medical center, you may need to be transferred to another facility. This information is not intended to replace advice given to you by your health care provider. Make sure you discuss any questions you have with your health care provider. Document Revised: 03/20/2021 Document Reviewed: 11/15/2020 Elsevier Patient Education  2024 [employer]   Health Maintenance, Male Adopting a healthy lifestyle and getting preventive care are important in promoting health and wellness. Ask your health care provider about: The right  schedule for you to have regular tests and exams. Things you can do on your own to prevent diseases and keep yourself healthy. What should I know about diet, weight, and exercise? Eat a healthy diet  Eat a diet that includes plenty of vegetables, fruits, low-fat dairy products, and lean protein. Do not eat a lot of foods that are high in solid fats, added sugars, or sodium. Maintain a healthy weight Body mass index (BMI) is a measurement that can be used to identify possible weight problems. It estimates body fat based on height and weight. Your health care provider can help determine your BMI and help you achieve or maintain a healthy weight. Get regular exercise Get regular exercise. This is one of the most important things you can do for your health. Most adults should: Exercise for at least 150 minutes each week. The exercise should increase your heart rate and make you sweat (moderate-intensity exercise). Do strengthening exercises at least twice a week. This is in addition to the moderate-intensity exercise. Spend less time sitting. Even light physical activity can be beneficial. Watch cholesterol and blood lipids Have your blood tested for lipids and cholesterol at 29 years of age, then have this test every 5 years. You may need to have your cholesterol levels checked more often if: Your lipid or cholesterol levels are high. You are older than 29 years of age. You are at high risk for heart disease. What should I know about cancer screening? Many types of cancers can be detected early and may often be prevented. Depending on your health history and family history, you may need to have cancer screening at various ages. This may include screening for: Colorectal cancer. Prostate cancer. Skin cancer. Lung cancer. What should I know about heart disease, diabetes, and high blood pressure? Blood pressure and heart disease High blood pressure causes heart disease and increases the risk of  stroke. This is more likely to develop in people who have high blood pressure readings or are overweight. Talk with your health care provider about your target blood pressure readings. Have your blood pressure checked: Every 3-5 years if you are 53-51 years of age. Every year if you are 53 years old or older. If you are between the ages of 69 and 56 and are a current or former smoker, ask your health care provider if you should have a one-time screening for abdominal aortic aneurysm (AAA). Diabetes Have regular diabetes screenings. This checks your fasting blood sugar level. Have the screening done: Once every three years after age 75 if you are at a normal weight  and have a low risk for diabetes. More often and at a younger age if you are overweight or have a high risk for diabetes. What should I know about preventing infection? Hepatitis B If you have a higher risk for hepatitis B, you should be screened for this virus. Talk with your health care provider to find out if you are at risk for hepatitis B infection. Hepatitis C Blood testing is recommended for: Everyone born from [birth year] through 1966. Anyone with known risk factors for hepatitis C. Sexually transmitted infections (STIs) You should be screened each year for STIs, including gonorrhea and chlamydia, if: You are sexually active and are younger than 29 years of age. You are older than 29 years of age and your health care provider tells you that you are at risk for this type of infection. Your sexual activity has changed since you were last screened, and you are at increased risk for chlamydia or gonorrhea. Ask your health care provider if you are at risk. Ask your health care provider about whether you are at high risk for HIV. Your health care provider may recommend a prescription medicine to help prevent HIV infection. If you choose to take medicine to prevent HIV, you should first get tested for HIV. You should then be tested every 3  months for as long as you are taking the medicine. Follow these instructions at home: Alcohol use Do not drink alcohol if your health care provider tells you not to drink. If you drink alcohol: Limit how much you have to 0-2 drinks a day. Know how much alcohol is in your drink. In the [place] one drink equals one 12 oz bottle of beer (355 mL), one 5 oz glass of wine (148 mL), or one 1 oz glass of hard liquor (44 mL). Lifestyle Do not use any products that contain nicotine or tobacco. These products include cigarettes, chewing tobacco, and vaping devices, such as e-cigarettes. If you need help quitting, ask your health care provider. Do not use street drugs. Do not share needles. Ask your health care provider for help if you need support or information about quitting drugs. General instructions Schedule regular health, dental, and eye exams. Stay current with your vaccines. Tell your health care provider if: You often feel depressed. You have ever been abused or do not feel safe at home. Summary Adopting a healthy lifestyle and getting preventive care are important in promoting health and wellness. Follow your health care provider's instructions about healthy diet, exercising, and getting tested or screened for diseases. Follow your health care provider's instructions on monitoring your cholesterol and blood pressure. This information is not intended to replace advice given to you by your health care provider. Make sure you discuss any questions you have with your health care provider. Document Revised: 11/26/2020 Document Reviewed: 11/26/2020 Elsevier Patient Education  2024 [employer].

## 2024-03-28 NOTE — Assessment & Plan Note (Signed)
 Low body mass index (BMI < 20)   Chronic low BMI with no significant malnutrition signs. Diet includes eggs, fruit, whole grains, and vegetables. Exercises regularly. Family history of thinness. No recent weight gain despite efforts to increase caloric intake. Ordered full hormone panel including testosterone to assess for potential hormonal causes of low BMI.

## 2024-03-29 LAB — TSH+PRL+FSH+TESTT+LH+DHEA S...

## 2024-03-29 LAB — APTT: aPTT: 30 s (ref 23–32)

## 2024-04-01 ENCOUNTER — Ambulatory Visit: Payer: Self-pay | Admitting: Internal Medicine

## 2024-04-02 LAB — IRON,TIBC AND FERRITIN PANEL
%SAT: 40 % (ref 20–48)
Ferritin: 115 ng/mL (ref 38–380)
Iron: 140 ug/dL (ref 50–195)
TIBC: 346 ug/dL (ref 250–425)

## 2024-04-02 LAB — HIV ANTIBODY (ROUTINE TESTING W REFLEX)
HIV 1&2 Ab, 4th Generation: NONREACTIVE
HIV FINAL INTERPRETATION: NEGATIVE

## 2024-04-02 LAB — HOMOCYSTEINE: Homocysteine: 7.9 umol/L (ref <=12.9)

## 2024-04-06 LAB — HCV RNA QUANT RFLX ULTRA OR GENOTYP: HCV Quant Baseline: NOT DETECTED [IU]/mL

## 2024-04-06 LAB — LUPUS ANTICOAGULANT PANEL

## 2024-04-06 LAB — TSH+PRL+FSH+TESTT+LH+DHEA S...

## 2025-03-29 ENCOUNTER — Encounter: Admitting: Internal Medicine
